# Patient Record
Sex: Male | Born: 1990 | Race: White | Hispanic: No | Marital: Married | Smoking: Former smoker
Health system: Southern US, Community
[De-identification: ages and names within clinical notes are randomized; demographics above are authoritative.]

## PROBLEM LIST (undated history)

## (undated) DIAGNOSIS — K219 Gastro-esophageal reflux disease without esophagitis: Secondary | ICD-10-CM

## (undated) DIAGNOSIS — F411 Generalized anxiety disorder: Secondary | ICD-10-CM

## (undated) DIAGNOSIS — F902 Attention-deficit hyperactivity disorder, combined type: Secondary | ICD-10-CM

## (undated) DIAGNOSIS — I10 Essential (primary) hypertension: Secondary | ICD-10-CM

## (undated) HISTORY — PX: APPENDECTOMY: SHX54

## (undated) HISTORY — DX: Attention-deficit hyperactivity disorder, combined type: F90.2

## (undated) HISTORY — PX: TONSILLECTOMY: SUR1361

## (undated) HISTORY — PX: TOOTH EXTRACTION: SHX859

---
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
## 2008-08-02 ENCOUNTER — Emergency Department: Payer: Self-pay | Admitting: Emergency Medicine

## 2008-08-08 DIAGNOSIS — F329 Major depressive disorder, single episode, unspecified: Secondary | ICD-10-CM | POA: Insufficient documentation

## 2008-08-08 DIAGNOSIS — F429 Obsessive-compulsive disorder, unspecified: Secondary | ICD-10-CM | POA: Insufficient documentation

## 2008-09-19 ENCOUNTER — Emergency Department: Payer: Self-pay | Admitting: Emergency Medicine

## 2009-02-22 DIAGNOSIS — I861 Scrotal varices: Secondary | ICD-10-CM | POA: Insufficient documentation

## 2011-02-07 ENCOUNTER — Emergency Department: Payer: Self-pay | Admitting: Emergency Medicine

## 2011-04-27 ENCOUNTER — Emergency Department: Payer: Self-pay | Admitting: Emergency Medicine

## 2013-11-12 ENCOUNTER — Emergency Department: Payer: Self-pay | Admitting: Emergency Medicine

## 2013-11-12 LAB — URINALYSIS, COMPLETE
Bacteria: NONE SEEN
Glucose,UR: NEGATIVE mg/dL (ref 0–75)
Ketone: NEGATIVE
Leukocyte Esterase: NEGATIVE
Ph: 6 (ref 4.5–8.0)
Specific Gravity: 1.01 (ref 1.003–1.030)
WBC UR: <1 /HPF (ref 0–5)

## 2013-11-12 LAB — COMPREHENSIVE METABOLIC PANEL
BUN: 9 mg/dL (ref 7–18)
Calcium, Total: 8.8 mg/dL (ref 8.5–10.1)
Chloride: 103 mmol/L (ref 98–107)
EGFR (African American): >60
EGFR (Non-African Amer.): >60
Glucose: 101 mg/dL — ABNORMAL HIGH (ref 65–99)
Osmolality: 275 (ref 275–301)
SGOT(AST): 14 U/L — ABNORMAL LOW (ref 15–37)

## 2013-11-12 LAB — CBC
HGB: 14.9 g/dL (ref 13.0–18.0)
MCH: 31.6 pg (ref 26.0–34.0)
MCHC: 34.7 g/dL (ref 32.0–36.0)
MCV: 91 fL (ref 80–100)
RBC: 4.71 10*6/uL (ref 4.40–5.90)
RDW: 13.4 % (ref 11.5–14.5)
WBC: 7.5 10*3/uL (ref 3.8–10.6)

## 2014-04-19 DIAGNOSIS — M5126 Other intervertebral disc displacement, lumbar region: Secondary | ICD-10-CM | POA: Insufficient documentation

## 2014-09-25 ENCOUNTER — Emergency Department: Payer: Self-pay | Admitting: Emergency Medicine

## 2014-10-17 ENCOUNTER — Emergency Department: Payer: Self-pay | Admitting: Emergency Medicine

## 2014-10-21 ENCOUNTER — Emergency Department: Payer: Self-pay | Admitting: Internal Medicine

## 2015-01-17 ENCOUNTER — Emergency Department: Payer: Self-pay | Admitting: Emergency Medicine

## 2015-02-01 ENCOUNTER — Emergency Department (HOSPITAL_COMMUNITY): Payer: BLUE CROSS/BLUE SHIELD

## 2015-02-01 ENCOUNTER — Emergency Department (HOSPITAL_COMMUNITY)
Admission: EM | Admit: 2015-02-01 | Discharge: 2015-02-01 | Disposition: A | Discharge status: Home / Self Care | Payer: BLUE CROSS/BLUE SHIELD | Attending: Emergency Medicine | Admitting: Emergency Medicine

## 2015-02-01 ENCOUNTER — Encounter (HOSPITAL_COMMUNITY): Payer: Self-pay

## 2015-02-01 DIAGNOSIS — R079 Chest pain, unspecified: Secondary | ICD-10-CM

## 2015-02-01 DIAGNOSIS — R0789 Other chest pain: Secondary | ICD-10-CM | POA: Insufficient documentation

## 2015-02-01 DIAGNOSIS — Z720 Tobacco use: Secondary | ICD-10-CM | POA: Insufficient documentation

## 2015-02-01 DIAGNOSIS — M79604 Pain in right leg: Secondary | ICD-10-CM | POA: Insufficient documentation

## 2015-02-01 DIAGNOSIS — R112 Nausea with vomiting, unspecified: Secondary | ICD-10-CM | POA: Insufficient documentation

## 2015-02-01 DIAGNOSIS — M791 Myalgia: Secondary | ICD-10-CM | POA: Diagnosis not present

## 2015-02-01 DIAGNOSIS — M79605 Pain in left leg: Secondary | ICD-10-CM | POA: Diagnosis not present

## 2015-02-01 LAB — CBC
HEMATOCRIT: 47.8 % (ref 39.0–52.0)
Hemoglobin: 16.1 g/dL (ref 13.0–17.0)
MCH: 29.9 pg (ref 26.0–34.0)
MCHC: 33.7 g/dL (ref 30.0–36.0)
MCV: 88.8 fL (ref 78.0–100.0)
Platelets: 232 10*3/uL (ref 150–400)
RBC: 5.38 MIL/uL (ref 4.22–5.81)
RDW: 13.4 % (ref 11.5–15.5)
WBC: 5.5 10*3/uL (ref 4.0–10.5)

## 2015-02-01 LAB — BASIC METABOLIC PANEL
ANION GAP: 5 (ref 5–15)
BUN: 10 mg/dL (ref 6–23)
CALCIUM: 9.6 mg/dL (ref 8.4–10.5)
CO2: 28 mmol/L (ref 19–32)
CREATININE: 0.77 mg/dL (ref 0.50–1.35)
Chloride: 107 mmol/L (ref 96–112)
GFR calc Af Amer: >90 mL/min (ref >90)
Glucose, Bld: 117 mg/dL — ABNORMAL HIGH (ref 70–99)
Potassium: 4.1 mmol/L (ref 3.5–5.1)
Sodium: 140 mmol/L (ref 135–145)

## 2015-02-01 LAB — HEPATIC FUNCTION PANEL
ALT: 17 U/L (ref 0–53)
AST: 20 U/L (ref 0–37)
Albumin: 4.6 g/dL (ref 3.5–5.2)
Alkaline Phosphatase: 59 U/L (ref 39–117)
BILIRUBIN TOTAL: 0.5 mg/dL (ref 0.3–1.2)
Bilirubin, Direct: 0.1 mg/dL (ref 0.0–0.5)
Indirect Bilirubin: 0.4 mg/dL (ref 0.3–0.9)
TOTAL PROTEIN: 7.9 g/dL (ref 6.0–8.3)

## 2015-02-01 LAB — D-DIMER, QUANTITATIVE (NOT AT ARMC)

## 2015-02-01 LAB — I-STAT TROPONIN, ED: Troponin i, poc: 0 ng/mL (ref 0.00–0.08)

## 2015-02-01 LAB — LIPASE, BLOOD: LIPASE: 18 U/L (ref 11–59)

## 2015-02-01 MED ORDER — ACETAMINOPHEN 500 MG PO TABS
1000.0000 mg | ORAL_TABLET | Freq: Once | ORAL | Status: AC
  Administered 2015-02-01: 1000 mg via ORAL
  Filled 2015-02-01: qty 2

## 2015-02-01 MED ORDER — ONDANSETRON 4 MG PO TBDP
4.0000 mg | ORAL_TABLET | Freq: Once | ORAL | Status: AC
  Administered 2015-02-01: 4 mg via ORAL
  Filled 2015-02-01: qty 1

## 2015-02-01 NOTE — Discharge Instructions (Signed)
Chest Pain (Nonspecific) Take Tylenol or Advil as directed for pain. Call the wellness Center or any of the numbers on the resource guide to get a primary care physician. Ask your new doctor to help you to stop smoking It is often hard to give a specific diagnosis for the cause of chest pain. There is always a chance that your pain could be related to something serious, such as a heart attack or a blood clot in the lungs. You need to follow up with your health care provider for further evaluation. CAUSES   Heartburn.  Pneumonia or bronchitis.  Anxiety or stress.  Inflammation around your heart (pericarditis) or lung (pleuritis or pleurisy).  A blood clot in the lung.  A collapsed lung (pneumothorax). It can develop suddenly on its own (spontaneous pneumothorax) or from trauma to the chest.  Shingles infection (herpes zoster virus). The chest wall is composed of bones, muscles, and cartilage. Any of these can be the source of the pain.  The bones can be bruised by injury.  The muscles or cartilage can be strained by coughing or overwork.  The cartilage can be affected by inflammation and become sore (costochondritis). DIAGNOSIS  Lab tests or other studies may be needed to find the cause of your pain. Your health care provider may have you take a test called an ambulatory electrocardiogram (ECG). An ECG records your heartbeat patterns over a 24-hour period. You may also have other tests, such as:  Transthoracic echocardiogram (TTE). During echocardiography, sound waves are used to evaluate how blood flows through your heart.  Transesophageal echocardiogram (TEE).  Cardiac monitoring. This allows your health care provider to monitor your heart rate and rhythm in real time.  Holter monitor. This is a portable device that records your heartbeat and can help diagnose heart arrhythmias. It allows your health care provider to track your heart activity for several days, if needed.  Stress  tests by exercise or by giving medicine that makes the heart beat faster. TREATMENT   Treatment depends on what may be causing your chest pain. Treatment may include:  Acid blockers for heartburn.  Anti-inflammatory medicine.  Pain medicine for inflammatory conditions.  Antibiotics if an infection is present.  You may be advised to change lifestyle habits. This includes stopping smoking and avoiding alcohol, caffeine, and chocolate.  You may be advised to keep your head raised (elevated) when sleeping. This reduces the chance of acid going backward from your stomach into your esophagus. Most of the time, nonspecific chest pain will improve within 2-3 days with rest and mild pain medicine.  HOME CARE INSTRUCTIONS   If antibiotics were prescribed, take them as directed. Finish them even if you start to feel better.  For the next few days, avoid physical activities that bring on chest pain. Continue physical activities as directed.  Do not use any tobacco products, including cigarettes, chewing tobacco, or electronic cigarettes.  Avoid drinking alcohol.  Only take medicine as directed by your health care provider.  Follow your health care provider's suggestions for further testing if your chest pain does not go away.  Keep any follow-up appointments you made. If you do not go to an appointment, you could develop lasting (chronic) problems with pain. If there is any problem keeping an appointment, call to reschedule. SEEK MEDICAL CARE IF:   Your chest pain does not go away, even after treatment.  You have a rash with blisters on your chest.  You have a fever. SEEK IMMEDIATE  MEDICAL CARE IF:   You have increased chest pain or pain that spreads to your arm, neck, jaw, back, or abdomen.  You have shortness of breath.  You have an increasing cough, or you cough up blood.  You have severe back or abdominal pain.  You feel nauseous or vomit.  You have severe weakness.  You  faint.  You have chills. This is an emergency. Do not wait to see if the pain will go away. Get medical help at once. Call your local emergency services (911 in U.S.). Do not drive yourself to the hospital. MAKE SURE YOU:   Understand these instructions.  Will watch your condition.  Will get help right away if you are not doing well or get worse. Document Released: 09/10/2005 Document Revised: 12/06/2013 Document Reviewed: 07/06/2008 [facility] Patient Information 2015 [place], [place]. This information is not intended to replace advice given to you by your health care provider. Make sure you discuss any questions you have with your health care provider.  Emergency Department Resource Guide 1) Find a Doctor and Pay Out of Pocket Although you won't have to find out who is covered by your insurance plan, it is a good idea to ask around and get recommendations. You will then need to call the office and see if the doctor you have chosen will accept you as a new patient and what types of options they offer for patients who are self-pay. Some doctors offer discounts or will set up payment plans for their patients who do not have insurance, but you will need to ask so you aren't surprised when you get to your appointment.  2) Contact Your Local Health Department Not all health departments have doctors that can see patients for sick visits, but many do, so it is worth a call to see if yours does. If you don't know where your local health department is, you can check in your phone book. The CDC also has a tool to help you locate your state's health department, and many state websites also have listings of all of their local health departments.  3) Find a Walk-in Clinic If your illness is not likely to be very severe or complicated, you may want to try a walk in clinic. These are popping up all over the country in pharmacies, drugstores, and shopping centers. They're usually staffed by nurse practitioners  or physician assistants that have been trained to treat common illnesses and complaints. They're usually fairly quick and inexpensive. However, if you have serious medical issues or chronic medical problems, these are probably not your best option.  No Primary Care Doctor: - Call Health Connect at  [phone] - they can help you locate a primary care doctor that  accepts your insurance, provides certain services, etc. - Physician Referral Service- [phone]  Chronic Pain Problems: Organization         Address  Phone   Notes  [facility]  [phone] Patients need to be referred by their primary care doctor.   Medication Assistance: Organization         Address  Phone   Notes  [facility]ication [facility] [address] [place] [place], [place] [address] [phone] --Must be a resident of [facility] -- Must have NO insurance coverage whatsoever (no Medicaid/ Medicare, etc.) -- The pt. MUST have a primary care doctor that directs their care regularly and follows them in the community   [facility]ist  [phone]  [employer]  [phone]    Agencies that provide inexpensive medical care: Organization         Address  Phone   Notes  [facility]  [phone]   [facility]    [phone]   [facility] [address] [place], [place] [address] [phone]   [facility] [address] [place] [place] [phone]   Planned Parenthood    [phone]   [facility]    [phone]   [facility]  [address], [place] Phone:  [phone], Fax:  [phone] Hours of Operation:  9 am - 6 pm, M-F.  Also accepts Medicaid/Medicare and self-pay.  [facility]  [address], [place] Phone: [phone], Fax: [phone]. Hours of Operation:  8:30 am - 5:30 pm, M-F.   Also accepts Medicaid and self-pay.  [facility] [place] [address] [place] Phone: [phone]   [facility] [address] Natasha Bence [place], [place] [phone], Ext. [phone] Mondays & Thursdays: 7-9 AM.  First 15 patients are seen on a first come, first serve basis.    Medicaid-accepting [facility] Providers:  Organization         Address  Phone   Notes  [facility] [address], [place] [phone] Also accepts self-pay patients.  [facility] [address] Laurell Josephs [place], [place]  [phone]   [facility] [address], [place] [phone]   [facility] [address], [place] [phone]   Renaye Rakers [address], [place]   [phone] Only accepts [place] patients after they have their name applied to their card.   Self-Pay (no insurance) in [facility]:  Organization         Address  Phone   Notes  Sickle Cell Patients, [facility] [address] [place], [place] [phone]   [facility] [address] [place], [place] [phone]   [facility] [place]  [address], [place] [phone]   [facility]/Dr. Osei-Bonsu  [address], [place] or [address], [place] [phone] Phone number for both [place] and [place] locations is the same.  [facility] [address] [place] [phone]   [facility] [address], [place] or [address] [phone]   [facility] [address] [place] [phone], phone; [phone], fax Sees patients 1st and 3rd Saturday of every month.  Must not qualify for public or private insurance (i.e. Medicaid, Medicare, [place] Health Choice, Veterans'  Benefits)  Household income should be no more than 200% of the poverty level The clinic cannot treat you if you are pregnant or think you are pregnant  Sexually transmitted diseases are not treated at the clinic.    Dental Care: Organization         Address  Phone  Notes  [facility] [address] [place], [place] [phone] Accepts children up to age 70 who are enrolled in [place] or [place] Health Choice; pregnant women with a Medicaid card;  and children who have applied for Medicaid or [place] Health Choice, but were declined, whose parents can pay a reduced fee at time of service.  [facility] [place]  [address], [place] [phone] Accepts children up to age 26 who are enrolled in [place] or [place] Health Choice; pregnant women with a Medicaid card; and children who have applied for Medicaid or [place] Health Choice, but were declined, whose parents can pay a reduced fee at time of service.  [facility]  [address], [place] [phone] Patients are seen by appointment only. Walk-ins are not accepted. [facility] will see patients 26 years of age and older. Monday - Tuesday (8am-5pm) Most Wednesdays (8:30-5pm) $30 per visit, cash only  [facility]  [address], [facility] [phone] Patients are seen by appointment only. Walk-ins are not accepted. [facility] will see patients 26 years of age and older. One Wednesday Evening (Monthly: Volunteer Based).  $30 per visit, cash only  [facility]  [phone] for adults; Children under age 534, call Graduate Pediatric Dentistry at [phone]. Children aged 624-14, please call [phone] to request a pediatric application.  Dental services are provided in all areas of dental care including fillings, crowns and bridges, complete and partial  dentures, implants, gum treatment, root canals, and extractions. Preventive care is also provided. Treatment is provided to both adults and children. Patients are selected via a lottery and there is often a waiting list.   [facility] [address], [place]  [phone] [email or web address]   [facility] [address], [place], [place] [phone], Ext. [phone] Second and Fourth Thursday of each month, opens at 6:30 AM; Clinic ends at 9 AM.  Patients are seen on a first-come first-served basis, and a limited number are seen during each clinic.   [facility]  [address], [place], [place] [phone]   Eligibility Requirements You must have lived in [place], [place], or [place] counties for at least the last three months.   You cannot be eligible for state or federal sponsored [place] Cityhealthcare insurance, including CIGNAVeterans Administration, [place]icaid, or [employer]icare.   You generally cannot be eligible for healthcare insurance through your employer.    How to apply: Eligibility screenings are held every Tuesday and Wednesday afternoon from 1:00 pm until 4:00 pm. You do not need an appointment for the interview!  [facility] [address], [place], [place] [phone]   [facility]  [phone]   [facility]  [phone]   [facility]  [phone]    Behavioral Health Resources in the Community: Intensive Outpatient Programs Organization         Address  Phone  Notes  [facility] [address], [place], [place] [phone]   [facility] [address], [place], [place] [phone]   [facility] [address], [place], [place]  [phone]   [facility] [address],  [place], [place] [phone] or [phone]   Substance Abuse Resources Organization          Address  Phone  Notes  [facility]  [phone]   [facility]  [phone]   [facility]  [phone]   [facility]  [phone]  Residential & Outpatient Substance Abuse Program  [phone]   Psychological Services Organization         Address  Phone  Notes  [facility] Health  [phone]   [facility] Services  [phone]   [facility] [phone] [address], [place] [phone] or [phone]    Mobile Crisis Teams Organization         Address  Phone  Notes  [facility], Mobile Crisis Care Unit  [phone]   [facility]  [address] [place], [place] [phone]   Doristine Locks [address] [place] [phone]    Self-Help/Support Groups Organization         Address  Phone             Notes  [facility] [place] - variety of support groups  [phone]- [phone] Call for more information  Narcotics Anonymous (NA), [facility] [address] [place] [address], [employer] [place]  2 meetings at this location   Statistician         Address  Phone  Notes  [facility] [address],    [place]  [phone]   [facility]  [address] [place] [address], [place], [place] [phone]   [facility] [address] [place], [place] [phone] Admissions: 8am-3pm M-F  [facility] [address],    [place], [place] [phone]   [facility] [address] [place], [place], [place] [phone]   [facility] [address]  [place], [place] [phone]   [facility] - Intensive Outpatient [address] Laurell Josephs [address], [place], [place] [phone]   [facility] ([facility] [address] [place].,  [place], [place] [phone] or [phone]   [facility] ([facility]) [address] [place], [place]  [phone] Accepts Medicaid  [facility] [address]  [place] [phone] Substance Abuse/Addiction Treatment   [facility] Organization         Address  Phone  Notes  [facility]  [phone]   Angie Fava, PhD [address] Ervin Knack [place], [place]   [phone] or [phone]   [facility] Behavioral   [address] [place], [place] [phone]   [facility] [address] [place], [place] [phone] Insurance/Medicaid/sponsorship through [facility] and Families [address]                                    [place], [place] [phone] Therapy/tele-psych/case  [facility] [address] [place], [place] [phone]    Dr. Lolly Mustache  [phone]   [facility]  [employer] [facility] Dept. 1) [address], [place] 2) [address], [place] 3)  [address], [place] [phone]  [phone]   [facility] Child Abuse Hotline (706)843-9871 or [phone] (After Hours)

## 2015-02-01 NOTE — ED Notes (Signed)
Pt presents with c/o chest pain for several days. Pt reports that the chest pain is in the left and center of his chest. Pt also reporting abdominal pain with vomiting. Denies diarrhea.

## 2015-02-01 NOTE — ED Notes (Signed)
Nurse starting IV 

## 2015-02-01 NOTE — ED Provider Notes (Signed)
CSN: [id number]     Arrival date & time 02/01/15  1219 History   First MD Initiated Contact with Patient 02/01/15 1226     Chief Complaint  Patient presents with  . Chest Pain     (Consider location/radiation/quality/duration/timing/severity/associated sxs/prior Treatment) HPI Complains of left anterior chest pain onset yesterday, constant. pain waxes and wanes with exacerbations lasting 10 seconds at a time. He reports he vomited once yesterday also complains of diffuse abdominal pain which is constant since yesterday pain is mild. No other associated symptoms. Pain is nonexertional. He also describes crampy pains in both calves and yesterday. No treatment prior to coming here nothing makes symptoms better or worse. Presently discomfort is mild and chest abdomen and bilateral calves History reviewed. No pertinent past medical history. Past Surgical History  Procedure Laterality Date  . Tonsillectomy     No family history on file. History  Substance Use Topics  . Smoking status: Current Every Day Smoker  . Smokeless tobacco: Not on file  . Alcohol Use: Yes     Comment: occasionally     Review of Systems  Cardiovascular: Positive for chest pain.  Gastrointestinal: Positive for nausea and vomiting.       Vomited one time yesterday. Slight nausea now  Musculoskeletal: Positive for myalgias.       Bilateral calf pain      Allergies  Sulfa antibiotics  Home Medications   Prior to Admission medications   Not on File   BP 145/83 mmHg  Pulse 79  Temp(Src) 98.3 F (36.8 C) (Oral)  Resp 20  SpO2 100% Physical Exam  Constitutional: He appears well-developed and well-nourished.  HENT:  Head: Normocephalic and atraumatic.  Eyes: Conjunctivae are normal. Pupils are equal, round, and reactive to light.  Neck: Neck supple. No tracheal deviation present. No thyromegaly present.  Cardiovascular: Normal rate and regular rhythm.   No murmur heard. Pulmonary/Chest: Effort normal  and breath sounds normal.  Abdominal: Soft. Bowel sounds are normal. He exhibits no distension. There is no tenderness.  Musculoskeletal: Normal range of motion. He exhibits no edema or tenderness.  Neurological: He is alert. Coordination normal.  Skin: Skin is warm and dry. No rash noted.  Psychiatric: He has a normal mood and affect.  Nursing note and vitals reviewed.   ED Course  Procedures (including critical care time) Labs Review Labs Reviewed  CBC  BASIC METABOLIC PANEL  I-STAT TROPOININ, ED    Imaging Review No results found.   EKG Interpretation   Date/Time:  Thursday February 01 2015 12:25:06 EST Ventricular Rate:  78 PR Interval:  162 QRS Duration: 94 QT Interval:  356 QTC Calculation: 405 R Axis:   88 Text Interpretation:  Sinus rhythm Right atrial enlargement Consider right  ventricular hypertrophy Baseline wander in lead(s) V2 V4 No old tracing to  compare Confirmed by Mina Babula  MD, Amylee Lodato [phone]([id number]) on 02/01/2015 12:35:58 PM     3:20 PM nausea and resolved after treatment with Zofran. Diffuse aches continue after treatment with Tylenol. Chest x-ray viewed by me Results for orders placed or performed during the hospital encounter of 02/01/15  Basic metabolic panel  Result Value Ref Range   Sodium 140 135 - 145 mmol/L   Potassium 4.1 3.5 - 5.1 mmol/L   Chloride 107 96 - 112 mmol/L   CO2 28 19 - 32 mmol/L   Glucose, Bld 117 (H) 70 - 99 mg/dL   BUN 10 6 - 23 mg/dL   Creatinine, Ser 1.910.77  0.50 - 1.35 mg/dL   Calcium 9.6 8.4 - 04.5 mg/dL   GFR calc non Af Amer >90 >90 mL/min   GFR calc Af Amer >90 >90 mL/min   Anion gap 5 5 - 15  Hepatic function panel  Result Value Ref Range   Total Protein 7.9 6.0 - 8.3 g/dL   Albumin 4.6 3.5 - 5.2 g/dL   AST 20 0 - 37 U/L   ALT 17 0 - 53 U/L   Alkaline Phosphatase 59 39 - 117 U/L   Total Bilirubin 0.5 0.3 - 1.2 mg/dL   Bilirubin, Direct 0.1 0.0 - 0.5 mg/dL   Indirect Bilirubin 0.4 0.3 - 0.9 mg/dL  Lipase, blood   Result Value Ref Range   Lipase 18 11 - 59 U/L  D-dimer, quantitative  Result Value Ref Range   D-Dimer, Quant <0.27 0.00 - 0.48 ug/mL-FEU  I-stat troponin, ED (not at [facility])  Result Value Ref Range   Troponin i, poc 0.00 0.00 - 0.08 ng/mL   Comment 3           Dg Chest 2 View  02/01/2015   CLINICAL DATA:  One day history of left-sided chest pain and difficulty breathing  EXAM: CHEST  2 VIEW  COMPARISON:  November 12, 2013  FINDINGS: The lungs are clear. Heart size and pulmonary vascularity are normal. No adenopathy. No pneumothorax. No bone lesions.  IMPRESSION: No edema or consolidation.   Electronically Signed   By: Bretta Bang III M.D.   On: 02/01/2015 13:42    MDM  Cardiac risk factors include smoker, family history father had  MI age21. Patient is PERC negative, Wells score for PE negative 2, Wells score for DVT neg 2. Heart score equals 1 Final diagnoses:  None   I counseled patient for 5 minutes on smoking cessation. Plan referral [facility] [facility]. Tylenol or Advil for pain. Diagnosed 1 atypical chest pain #2 tobacco abuse    Doug Sou, MD 02/01/15 1525

## 2015-02-01 NOTE — ED Notes (Signed)
Pt c/o nausea, spoke with ED provider, 4 mg Zofran po prescribed

## 2015-06-08 ENCOUNTER — Other Ambulatory Visit: Payer: Self-pay

## 2015-06-08 ENCOUNTER — Encounter: Payer: Self-pay | Admitting: *Deleted

## 2015-06-08 ENCOUNTER — Emergency Department
Admission: EM | Admit: 2015-06-08 | Discharge: 2015-06-08 | Disposition: A | Discharge status: Home / Self Care | Payer: BLUE CROSS/BLUE SHIELD | Attending: Emergency Medicine | Admitting: Emergency Medicine

## 2015-06-08 DIAGNOSIS — R002 Palpitations: Secondary | ICD-10-CM | POA: Insufficient documentation

## 2015-06-08 DIAGNOSIS — R0789 Other chest pain: Secondary | ICD-10-CM | POA: Diagnosis not present

## 2015-06-08 DIAGNOSIS — Z720 Tobacco use: Secondary | ICD-10-CM | POA: Insufficient documentation

## 2015-06-08 DIAGNOSIS — R000 Tachycardia, unspecified: Secondary | ICD-10-CM | POA: Diagnosis present

## 2015-06-08 LAB — COMPREHENSIVE METABOLIC PANEL
ALBUMIN: 4.3 g/dL (ref 3.5–5.0)
ALT: 19 U/L (ref 17–63)
AST: 19 U/L (ref 15–41)
Alkaline Phosphatase: 52 U/L (ref 38–126)
Anion gap: 8 (ref 5–15)
BILIRUBIN TOTAL: 0.5 mg/dL (ref 0.3–1.2)
BUN: 12 mg/dL (ref 6–20)
CO2: 27 mmol/L (ref 22–32)
CREATININE: 0.75 mg/dL (ref 0.61–1.24)
Calcium: 9.6 mg/dL (ref 8.9–10.3)
Chloride: 104 mmol/L (ref 101–111)
GFR calc Af Amer: >60 mL/min (ref >60)
GFR calc non Af Amer: >60 mL/min (ref >60)
Glucose, Bld: 95 mg/dL (ref 65–99)
POTASSIUM: 4.4 mmol/L (ref 3.5–5.1)
SODIUM: 139 mmol/L (ref 135–145)
Total Protein: 7.3 g/dL (ref 6.5–8.1)

## 2015-06-08 LAB — CBC
HEMATOCRIT: 44.6 % (ref 40.0–52.0)
HEMOGLOBIN: 14.8 g/dL (ref 13.0–18.0)
MCH: 29.9 pg (ref 26.0–34.0)
MCHC: 33.3 g/dL (ref 32.0–36.0)
MCV: 89.8 fL (ref 80.0–100.0)
PLATELETS: 188 10*3/uL (ref 150–440)
RBC: 4.97 MIL/uL (ref 4.40–5.90)
RDW: 14.4 % (ref 11.5–14.5)
WBC: 7.3 10*3/uL (ref 3.8–10.6)

## 2015-06-08 LAB — TROPONIN I: Troponin I: <0.03 ng/mL (ref <0.031)

## 2015-06-08 NOTE — Discharge Instructions (Signed)
Your EKG looks good. Your cardiac enzyme was negative. Her other blood tests were good. Your blood pressure and heart rate are very good. Follow-up with another doctor for ongoing care. If you do have further episodes of feeling like your heart rate is fast or if you have any palpitations, measure her pulse as we have discussed. Consider making a plan to quit smoking. He had no advantage of continuing. It's a waste of money in bad for you. Return to the emergency department if you have increasing pain, shortness of breath, fast heart rate, or give other urgent concerns.  Palpitations A palpitation is the feeling that your heartbeat is irregular. It may feel like your heart is fluttering or skipping a beat. It may also feel like your heart is beating faster than normal. This is usually not a serious problem. In some cases, you may need more medical tests. HOME CARE  Avoid:  Caffeine in coffee, tea, soft drinks, diet pills, and energy drinks.  Chocolate.  Alcohol.  Stop smoking if you smoke.  Reduce your stress and anxiety. Try:  A method that measures bodily functions so you can learn to control them (biofeedback).  Yoga.  Meditation.  Physical activity such as swimming, jogging, or walking.  Get plenty of rest and sleep. GET HELP IF:  Your fast or irregular heartbeat continues after 24 hours.  Your palpitations occur more often. GET HELP RIGHT AWAY IF:   You have chest pain.  You feel short of breath.  You have a very bad headache.  You feel dizzy or pass out (faint). MAKE SURE YOU:   Understand these instructions.  Will watch your condition.  Will get help right away if you are not doing well or get worse. Document Released: 09/09/2008 Document Revised: 04/17/2014 Document Reviewed: 01/30/2012 [facility] Patient Information 2015 [place], [place]. This information is not intended to replace advice given to you by your health care provider. Make sure you discuss any  questions you have with your health care provider.

## 2015-06-08 NOTE — ED Notes (Signed)
See assessment notes. Pt states "this has happened to me before and everyone tells me it sounds like a panic attack."

## 2015-06-08 NOTE — ED Notes (Signed)
Has had episodes of left chest pain for several months. Yesterday developed tingling all over, works in hot environment, felt pounding in chest, pain in left chest

## 2015-06-08 NOTE — ED Provider Notes (Signed)
[facility] Emergency Department Provider Note  ____________________________________________  Time seen: 1950  I have reviewed the triage vital signs and the nursing notes.   HISTORY  Chief Complaint Tachycardia palpitations yesterday Chest pain for 6 months    HPI Lee Sandoval is a 24 y.o. male who was at work yesterday when he began to feel that his heart was going fast. He works in a lab. 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 He reports he was approximately 93 in the lab. He left the lab and went for a walk to try to cool down. He felt his heart was beating fast and he tried to take his pulse. He measured it out for 1 minute and reports the heart rate was between 90 and 100.  He felt anxious because of the faster rate and then felt the palpitations were worse.  Patient left work and went home. He was feeling a little bit better but he noticed he had some chest pain and his left chest. He reports this pain has been there for approximately 6 months. He was seen at [facility] long for this condition approximate 6 months ago with a negative workup.  Patient also notes that last night he was engaged in sexual activity with his wife and he felt his heart was pounding. At that time he did not feel the rate was fast.    History reviewed. No pertinent past medical history.  There are no active problems to display for this patient.   Past Surgical History  Procedure Laterality Date  . Tonsillectomy      Current Outpatient Rx  Name  Route  Sig  Dispense  Refill  . ibuprofen (ADVIL,MOTRIN) 800 MG tablet   Oral   Take 800 mg by mouth every 6 (six) hours as needed for headache or moderate pain.           Allergies Sulfa antibiotics  No family history on file.  Social History History  Substance Use Topics  . Smoking status: Current Every Day Smoker  . Smokeless tobacco: Not on file  . Alcohol Use: Yes     Comment: occasionally     Review of  Systems  Constitutional: Negative for fever. ENT: Negative for sore throat. Cardiovascular: Palpitations and some history of chest discomfort. See history of present illness Respiratory: Negative for shortness of breath. Gastrointestinal: Negative for abdominal pain, vomiting and diarrhea. Genitourinary: Negative for dysuria. Musculoskeletal: No myalgias or injuries. Skin: Negative for rash. Neurological: Negative for headaches   10-point ROS otherwise negative.  ____________________________________________   PHYSICAL EXAM:  VITAL SIGNS: ED Triage Vitals  Enc Vitals Group     BP 06/08/15 1619 132/75 mmHg     Pulse Rate 06/08/15 1619 82     Resp 06/08/15 1619 20     Temp 06/08/15 1619 98.1 F (36.7 C)     Temp Source 06/08/15 1619 Oral     SpO2 06/08/15 1619 98 %     Weight --      Height --      Head Cir --      Peak Flow --      Pain Score 06/08/15 1620 6     Pain Loc --      Pain Edu? --      Excl. in GC? --     Constitutional: Well-nourished well-developed 24 year old male. Alert and oriented. Well appearing and in no distress. ENT   Head: Normocephalic and atraumatic.   Nose: No congestion/rhinnorhea.  Mouth/Throat: Mucous membranes are moist. Cardiovascular: Normal rate in the 70s, regular rhythm, no murmur noted Chest wall: Mild but notable tenderness in the left chest. Respiratory:  Normal respiratory effort, no tachypnea.    Breath sounds are clear and equal bilaterally.  Gastrointestinal: Soft and nontender. No distention.  Back: No muscle spasm, no tenderness, no CVA tenderness. Musculoskeletal: No deformity noted. Nontender with normal range of motion in all extremities.  No noted edema. Neurologic:  Normal speech and language. No gross focal neurologic deficits are appreciated.  Skin:  Skin is warm, dry. No rash noted. Psychiatric: Mood and affect are normal. Speech and behavior are normal. Patient is a little bit anxious about these  episodes. ____________________________________________    LABS (pertinent positives/negatives)  Normal CBC, calm breaths metabolic panel, and troponin. ____________________________________________   EKG  ED ECG REPORT I, Delmore Sear W, the attending physician, personally viewed and interpreted this ECG.   Date: 06/08/2015  EKG Time: 1635  Rate: 70  Rhythm: Normal sinus rhythm  Axis: Normal  Intervals: Normal  ST&T Change: None noted   ____________________________________________  ____________________________________________   INITIAL IMPRESSION / ASSESSMENT AND PLAN / ED COURSE  Pertinent labs & imaging results that were available during my care of the patient were reviewed by me and considered in my medical decision making (see chart for details).   Well and healthy appearing 24 year old male in no acute distress. He does have some nervousness and anxiety about his events yesterday as well as about the discomfort he has had in his chest on palpation for over 6 months. This patient may benefit from further outpatient evaluation, although my clinical suspicion for a more significant pathology is extremely low. With normal blood tests and a normal EKG, I will discharge him for follow-up. We will refer him to [facility] clinic for any ongoing evaluation.  I have discussed smoking cessation with this patient. We discussed different strategies for quitting and his motivations for continuing.  ____________________________________________   FINAL CLINICAL IMPRESSION(S) / ED DIAGNOSES  Final diagnoses:  Heart palpitations  Chest wall pain      Darien Ramus, MD 06/08/15 2013

## 2015-06-12 ENCOUNTER — Ambulatory Visit (INDEPENDENT_AMBULATORY_CARE_PROVIDER_SITE_OTHER): Payer: BLUE CROSS/BLUE SHIELD | Admitting: Family Medicine

## 2015-06-12 ENCOUNTER — Encounter: Payer: Self-pay | Admitting: Family Medicine

## 2015-06-12 VITALS — BP 118/64 | HR 80 | Temp 97.3°F | Resp 18 | Ht 73.5 in | Wt 153.9 lb

## 2015-06-12 DIAGNOSIS — G4701 Insomnia due to medical condition: Secondary | ICD-10-CM | POA: Diagnosis not present

## 2015-06-12 DIAGNOSIS — F411 Generalized anxiety disorder: Secondary | ICD-10-CM

## 2015-06-12 DIAGNOSIS — F17210 Nicotine dependence, cigarettes, uncomplicated: Secondary | ICD-10-CM | POA: Insufficient documentation

## 2015-06-12 DIAGNOSIS — Z720 Tobacco use: Secondary | ICD-10-CM | POA: Diagnosis not present

## 2015-06-12 MED ORDER — DIAZEPAM 5 MG PO TABS: 5.0000 mg | ORAL_TABLET | Freq: Two times a day (BID) | ORAL | Status: DC | PRN

## 2015-06-12 MED ORDER — VENLAFAXINE HCL 37.5 MG PO TABS: 37.5000 mg | ORAL_TABLET | Freq: Two times a day (BID) | ORAL | Status: DC

## 2015-06-12 MED ORDER — TRAZODONE HCL 50 MG PO TABS: 50.0000 mg | ORAL_TABLET | Freq: Every day | ORAL | Status: DC

## 2015-06-12 NOTE — Progress Notes (Signed)
Name: York CeriseMichael Allen Beebe   MRN: [id number]    DOB: Mar 09, 1991   Date:06/12/2015       Progress Note  Subjective  Chief Complaint  Chief Complaint  Patient presents with  . Establish Care  . Follow-up    patient is here for a ER follow-up.     HPI  Anxiety: Patient complains of anxiety disorder, panic attacks and sleep disturbance.  He has the following symptoms: chest pain, feelings of losing control, insomnia, palpitations, racing thoughts, shortness of breath, sweating. Onset of symptoms was approximately several years ago, gradually worsening since that time. He denies current suicidal and homicidal ideation. Family history significant for anxiety and depression.Possible organic causes contributing are: none. Risk factors: positive family history in  mother and negative life event recently his biological father passed away and he attended the funeral where several people had no idea who he was. He took a friend's valium and he found that it really calmed him down and he was able to attend the funeral. Previous treatment includes Valium and psychiatry therapy when he was a child.  He complains of the following side effects from the treatment: none. Associated medical history includes ADD/ADHD. However he functions well at work and is actually quite productive. His boss is more concerned about his social interactions and panic episodes consisting of sudden onset chest pain, palpitations, perspiration, tachypnea. He feels this way around his co-workers and larger social gatherings of familiar people but not strangers. 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  In the past has tried Medical sales representativetraterra, Adderall, Seroquel. Had various intolerances such as feeling like a zombie, eye swelling, weight loss.   History  Substance Use Topics  . Smoking status: Current  Every Day Smoker -- 1.00 packs/day    Types: Cigarettes  . Smokeless tobacco: Never Used  . Alcohol Use: 0.0 oz/week    0 Standard drinks or equivalent per week     Comment: occasionally     No current outpatient prescriptions on file.  Past Surgical History  Procedure Laterality Date  . Tonsillectomy    . Tooth extraction      Family History  Problem Relation Age of Onset  . Heart failure Father     died at age 26    Allergies  Allergen Reactions  . Sulfa Antibiotics Hives    High fever.      Review of Systems  CONSTITUTIONAL: No significant weight changes, fever, chills, weakness or fatigue.  HEENT:  - Eyes: No visual changes.  - Ears: No auditory changes. No pain.  - Nose: No sneezing, congestion, runny nose. - Throat: No sore throat. No changes in swallowing. SKIN: No rash or itching.  CARDIOVASCULAR: No chest pain, chest pressure or chest discomfort. No palpitations or edema.  RESPIRATORY: No shortness of breath, cough or sputum.  GASTROINTESTINAL: No anorexia, nausea, vomiting. No changes in bowel habits. No abdominal pain or blood.  GENITOURINARY: No dysuria. No frequency. No discharge. NEUROLOGICAL: No headache, dizziness, syncope, paralysis, ataxia, numbness or tingling in the extremities. No memory changes. No change in bowel or bladder control.  MUSCULOSKELETAL: No joint pain. No muscle pain. HEMATOLOGIC: No anemia, bleeding or bruising.  LYMPHATICS: No enlarged lymph nodes.  PSYCHIATRIC: Significant changes. ENDOCRINOLOGIC: No reports of sweating, cold or heat intolerance. No polyuria or polydipsia.   Objective  BP  118/64 mmHg  Pulse 80  Temp(Src) 97.3 F (36.3 C) (Oral)  Resp 18  Ht 6' 1.5" (1.867 m)  Wt 153 lb 14.4 oz (69.809 kg)  BMI 20.03 kg/m2  SpO2 96% Body mass index is 20.03 kg/(m^2).  Depression screen PHQ 2/9 06/12/2015  Decreased Interest 1  Down, Depressed, Hopeless 3  PHQ - 2 Score 4  Altered sleeping 3  Tired, decreased  energy 3  Change in appetite 0  Feeling bad or failure about yourself  0  Trouble concentrating 3  Moving slowly or fidgety/restless 3  Suicidal thoughts 0  PHQ-9 Score 16  Difficult doing work/chores Very difficult     Physical Exam  Constitutional: Patient appears well-developed and well-nourished. In no distress.  HEENT:  - Head: Normocephalic and atraumatic.  - Ears: Bilateral TMs gray, no erythema or effusion - Nose: Nasal mucosa moist - Mouth/Throat: Oropharynx is clear and moist. No tonsillar hypertrophy or erythema. No post nasal drainage.  - Eyes: Conjunctivae clear, EOM movements normal. PERRLA. No scleral icterus.  Neck: Normal range of motion. Neck supple. No JVD present. No thyromegaly present.  Cardiovascular: Normal rate, regular rhythm and normal heart sounds.  No murmur heard.  Pulmonary/Chest: Effort normal and breath sounds normal. No respiratory distress. Musculoskeletal: Normal range of motion bilateral UE and LE, no joint effusions. Peripheral vascular: Bilateral LE no edema. Neurological: CN II-XII grossly intact with no focal deficits. Alert and oriented to person, place, and time. Coordination, balance, strength, speech and gait are normal.  Skin: Skin is warm and dry. No rash noted. No erythema.  Psychiatric: Patient is expressive, intelligent and communicative. Behavior is normal in office today. Judgment and thought content normal in office today.    Assessment & Plan  1. Generalized anxiety disorder Has been seen in the ER with negative blood work including troponins, ECG, CXR. We had a good discussion regarding his past and correlation to his current symptoms which I do not feel are cardiac in nature but rather generalized anxiety disorder with panic attacks. He has not been on an SSRI in the past but note that his cousin did not like how it made him feel. Given his symptoms correlate with more GAD than depression I feel that he would benefit from an  SNRI along with prn benzodiazepine use. I encouraged Herman to see a psychiatrist or psychologist as well but he has declined for now due to finances and resources.   The patient has been counseled on the proper use, side effects and potential interactions of the new medication. Patient encouraged to review the side effects and safety profile pamphlet provided with the prescription from the pharmacy as well as request counseling from the pharmacy team as needed. Call if worsening symptoms. Do not start all new medications at once.    - venlafaxine (EFFEXOR) 37.5 MG tablet; Take 1 tablet (37.5 mg total) by mouth 2 (two) times daily.  Dispense: 60 tablet; Refill: 1 - diazepam (VALIUM) 5 MG tablet; Take 1 tablet (5 mg total) by mouth 2 (two) times daily as needed for anxiety.  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  - traZODone (DESYREL) 50 MG tablet; Take 1 tablet (50 mg total) by mouth at bedtime.  Dispense: 30 tablet; Refill: 1

## 2015-06-12 NOTE — Patient Instructions (Signed)
Insomnia  Medication to try OTC: SLEEP MD   Insomnia is frequent trouble falling and/or staying asleep. Insomnia can be a long term problem or a short term problem. Both are common. Insomnia can be a short term problem when the wakefulness is related to a certain stress or worry. Long term insomnia is often related to ongoing stress during waking hours and/or poor sleeping habits. Overtime, sleep deprivation itself can make the problem worse. Every little thing feels more severe because you are overtired and your ability to cope is decreased. CAUSES   Stress, anxiety, and depression.  Poor sleeping habits.  Distractions such as TV in the bedroom.  Naps close to bedtime.  Engaging in emotionally charged conversations before bed.  Technical reading before sleep.  Alcohol and other sedatives. They may make the problem worse. They can hurt normal sleep patterns and normal dream activity.  Stimulants such as caffeine for several hours prior to bedtime.  Pain syndromes and shortness of breath can cause insomnia.  Exercise late at night.  Changing time zones may cause sleeping problems (jet lag). It is sometimes helpful to have someone observe your sleeping patterns. They should look for periods of not breathing during the night (sleep apnea). They should also look to see how long those periods last. If you live alone or observers are uncertain, you can also be observed at a sleep clinic where your sleep patterns will be professionally monitored. Sleep apnea requires a checkup and treatment. Give your caregivers your medical history. Give your caregivers observations your family has made about your sleep.  SYMPTOMS   Not feeling rested in the morning.  Anxiety and restlessness at bedtime.  Difficulty falling and staying asleep. TREATMENT   Your caregiver may prescribe treatment for an underlying medical disorders. Your caregiver can give advice or help if you are using alcohol or other  drugs for self-medication. Treatment of underlying problems will usually eliminate insomnia problems.  Medications can be prescribed for short time use. They are generally not recommended for lengthy use.  Over-the-counter sleep medicines are not recommended for lengthy use. They can be habit forming.  You can promote easier sleeping by making lifestyle changes such as:  Using relaxation techniques that help with breathing and reduce muscle tension.  Exercising earlier in the day.  Changing your diet and the time of your last meal. No night time snacks.  Establish a regular time to go to bed.  Counseling can help with stressful problems and worry.  Soothing music and white noise may be helpful if there are background noises you cannot remove.  Stop tedious detailed work at least one hour before bedtime. HOME CARE INSTRUCTIONS   Keep a diary. Inform your caregiver about your progress. This includes any medication side effects. See your caregiver regularly. Take note of:  Times when you are asleep.  Times when you are awake during the night.  The quality of your sleep.  How you feel the next day. This information will help your caregiver care for you.  Get out of bed if you are still awake after 15 minutes. Read or do some quiet activity. Keep the lights down. Wait until you feel sleepy and go back to bed.  Keep regular sleeping and waking hours. Avoid naps.  Exercise regularly.  Avoid distractions at bedtime. Distractions include watching television or engaging in any intense or detailed activity like attempting to balance the household checkbook.  Develop a bedtime ritual. Keep a familiar routine of bathing,  brushing your teeth, climbing into bed at the same time each night, listening to soothing music. Routines increase the success of falling to sleep faster.  Use relaxation techniques. This can be using breathing and muscle tension release routines. It can also include  visualizing peaceful scenes. You can also help control troubling or intruding thoughts by keeping your mind occupied with boring or repetitive thoughts like the old concept of counting sheep. You can make it more creative like imagining planting one beautiful flower after another in your backyard garden.  During your day, work to eliminate stress. When this is not possible use some of the previous suggestions to help reduce the anxiety that accompanies stressful situations. MAKE SURE YOU:   Understand these instructions.  Will watch your condition.  Will get help right away if you are not doing well or get worse. Document Released: 11/28/2000 Document Revised: 02/23/2012 Document Reviewed: 12/29/2007 [facility] Patient Information 2015 [place], [place]. This information is not intended to replace advice given to you by your health care provider. Make sure you discuss any questions you have with your health care provider. Generalized Anxiety Disorder Generalized anxiety disorder (GAD) is a mental disorder. It interferes with life functions, including relationships, work, and school. GAD is different from normal anxiety, which everyone experiences at some point in their lives in response to specific life events and activities. Normal anxiety actually helps [place] prepare for and get through these life events and activities. Normal anxiety goes away after the event or activity is over.  GAD causes anxiety that is not necessarily related to specific events or activities. It also causes excess anxiety in proportion to specific events or activities. The anxiety associated with GAD is also difficult to control. GAD can vary from mild to severe. People with severe GAD can have intense waves of anxiety with physical symptoms (panic attacks).  SYMPTOMS The anxiety and worry associated with GAD are difficult to control. This anxiety and worry are related to many life events and activities and also occur more days  than not for 6 months or longer. People with GAD also have three or more of the following symptoms (one or more in children):  Restlessness.   Fatigue.  Difficulty concentrating.   Irritability.  Muscle tension.  Difficulty sleeping or unsatisfying sleep. DIAGNOSIS GAD is diagnosed through an assessment by your health care provider. Your health care provider will ask you questions aboutyour mood,physical symptoms, and events in your life. Your health care provider may ask you about your medical history and use of alcohol or drugs, including prescription medicines. Your health care provider may also do a physical exam and blood tests. Certain medical conditions and the use of certain substances can cause symptoms similar to those associated with GAD. Your health care provider may refer you to a mental health specialist for further evaluation. TREATMENT The following therapies are usually used to treat GAD:   Medication. Antidepressant medication usually is prescribed for long-term daily control. Antianxiety medicines may be added in severe cases, especially when panic attacks occur.   Talk therapy (psychotherapy). Certain types of talk therapy can be helpful in treating GAD by providing support, education, and guidance. A form of talk therapy called cognitive behavioral therapy can teach you healthy ways to think about and react to daily life events and activities.  Stress managementtechniques. These include yoga, meditation, and exercise and can be very helpful when they are practiced regularly. A mental health specialist can help determine which treatment is best  for you. Some people see improvement with one therapy. However, other people require a combination of therapies. Document Released: 03/28/2013 Document Revised: 04/17/2014 Document Reviewed: 03/28/2013 [facility] Patient Information 2015 [place], [place]. This information is not intended to replace advice given to you by your  health care provider. Make sure you discuss any questions you have with your health care provider.

## 2015-06-28 ENCOUNTER — Telehealth: Payer: Self-pay

## 2015-06-28 NOTE — Telephone Encounter (Signed)
Patient called stating he has been having palpitations and that his heart rate has slowed down or skipped a beat. Patient was already encouraged by Cyndee BrightlyMiel Mock to go to the nearest ER since we had no openings to see. Patient was encouraged to come in next week for a visit but if he has another episode then to go to the ER. Patient agreed.

## 2015-07-02 ENCOUNTER — Ambulatory Visit: Payer: BLUE CROSS/BLUE SHIELD | Admitting: Family Medicine

## 2015-07-04 ENCOUNTER — Ambulatory Visit (INDEPENDENT_AMBULATORY_CARE_PROVIDER_SITE_OTHER): Payer: BLUE CROSS/BLUE SHIELD | Admitting: Family Medicine

## 2015-07-04 ENCOUNTER — Encounter: Payer: Self-pay | Admitting: Family Medicine

## 2015-07-04 VITALS — BP 102/78 | HR 78 | Temp 98.1°F | Resp 16 | Ht 73.5 in | Wt 152.3 lb

## 2015-07-04 DIAGNOSIS — R002 Palpitations: Secondary | ICD-10-CM | POA: Diagnosis not present

## 2015-07-04 DIAGNOSIS — F411 Generalized anxiety disorder: Secondary | ICD-10-CM | POA: Diagnosis not present

## 2015-07-04 NOTE — Patient Instructions (Signed)

## 2015-07-04 NOTE — Progress Notes (Signed)
Name: Lee Sandoval   MRN: [id number]    DOB: Aug 09, 1991   Date:07/04/2015       Progress Note  Subjective  Chief Complaint  Chief Complaint  Patient presents with  . Palpitations    HPI  Palpitations: Patient complains of palpitations, rapid heart beat, shortness of breath and slow heart beat.  Last week he was laying on his couch calm and had just taken his Valium all of a sudden he felt that his heart rate slowed down and stopped, he jumped up suddenly and gasped for a breath and then his heart rate went up and then settled down to normal after a few minutes. The symptoms are of transient, off and on and brief severity, occuring intermittently and lasting a few seconds per episode. Cardiac risk factors include: male gender. Aggravating factors: stress/anxiety. Relieving factors: position change and spontaneous resolution. Associated signs and symptoms: has no complaint(s) of chest pain, claudication, dyspnea, exertional chest pressure/discomfort, lower extremity edema, near-syncope, paroxysmal nocturnal dyspnea and syncope but does have complaint(s) of improved sensation when lying on right side vs left side.    Anxiety: Patient complains of anxiety disorder, panic attacks and sleep disturbance. He has the following symptoms: chest pain, feelings of losing control, insomnia, palpitations, racing thoughts, shortness of breath, sweating. Onset of symptoms was approximately several years ago, gradually worsening since that time. He denies current suicidal and homicidal ideation. Family history significant for anxiety and depression.Possible organic causes contributing are: none. Risk factors: positive family history in mother and negative life event recently his biological father passed away and he attended the funeral where several people had no idea who he was. Recently treatment started Venlafaxine 37.5 twice a day, Valium $RemoveBef'5mg'[id number]$  twice a day as needed, trazodone at bedtime as needed.  He  complains of the following side effects from the treatment: not sure if related to recent palpitations he has had but he has stopped all medication since last week.      Patient Active Problem List   Diagnosis Date Noted  . Cigarette smoker 06/12/2015    History  Substance Use Topics  . Smoking status: Current Every Day Smoker -- 1.00 packs/day    Types: Cigarettes  . Smokeless tobacco: Never Used  . Alcohol Use: 0.0 oz/week    0 Standard drinks or equivalent per week     Comment: occasionally      Current outpatient prescriptions:  .  diazepam (VALIUM) 5 MG tablet, Take 1 tablet (5 mg total) by mouth 2 (two) times daily as needed for anxiety., Disp: 40 tablet, Rfl: 1 .  traZODone (DESYREL) 50 MG tablet, Take 1 tablet (50 mg total) by mouth at bedtime., Disp: 30 tablet, Rfl: 1 .  venlafaxine (EFFEXOR) 37.5 MG tablet, Take 1 tablet (37.5 mg total) by mouth 2 (two) times daily., Disp: 60 tablet, Rfl: 1  Past Surgical History  Procedure Laterality Date  . Tonsillectomy    . Tooth extraction      Family History  Problem Relation Age of Onset  . Heart failure Father     died at age 26  . Depression Mother   . Mental illness Mother     Allergies  Allergen Reactions  . Sulfa Antibiotics Hives    High fever.      Review of Systems  CONSTITUTIONAL: No significant weight changes, fever, chills, weakness or fatigue.  HEENT:  - Eyes: No visual changes.  - Ears: No auditory changes. No pain.  - Nose:  No sneezing, congestion, runny nose. - Throat: No sore throat. No changes in swallowing. SKIN: No rash or itching.  CARDIOVASCULAR: No chest pain, chest pressure or chest discomfort. Yes irregular beat sensation. RESPIRATORY: No shortness of breath, cough or sputum.  GASTROINTESTINAL: No anorexia, nausea, vomiting. No changes in bowel habits. No abdominal pain or blood.  GENITOURINARY: No dysuria. No frequency. No discharge. NEUROLOGICAL: No headache, dizziness,  syncope, paralysis, ataxia, numbness or tingling in the extremities. No memory changes. No change in bowel or bladder control.  MUSCULOSKELETAL: No joint pain. No muscle pain. HEMATOLOGIC: No anemia, bleeding or bruising.  LYMPHATICS: No enlarged lymph nodes.  PSYCHIATRIC: Significant changes. ENDOCRINOLOGIC: No reports of sweating, cold or heat intolerance. No polyuria or polydipsia.   Objective  BP 102/78 mmHg  Pulse 78  Temp(Src) 98.1 F (36.7 C) (Oral)  Resp 16  Ht 6' 1.5" (1.867 m)  Wt 152 lb 4.8 oz (69.083 kg)  BMI 19.82 kg/m2  SpO2 98% Body mass index is 19.82 kg/(m^2).  Physical Exam  Constitutional: Patient appears well-developed and well-nourished. Tall and thin. In no distress.  HEENT:  - Head: Normocephalic and atraumatic.  - Ears: Bilateral TMs gray, no erythema or effusion - Nose: Nasal mucosa moist - Mouth/Throat: Oropharynx is clear and moist. No tonsillar hypertrophy or erythema. No post nasal drainage.  - Eyes: Conjunctivae clear, EOM movements normal. PERRLA. No scleral icterus.  Neck: Normal range of motion. Neck supple. No JVD present. No thyromegaly present.  Cardiovascular: Normal rate, regular rhythm and normal heart sounds while laying on back, sitting, right side and left side. No murmur heard.  Pulmonary/Chest: Effort normal and breath sounds normal. No respiratory distress. Musculoskeletal: Normal range of motion bilateral UE and LE, no joint effusions. Peripheral vascular: Bilateral LE no edema. Neurological: CN II-XII grossly intact with no focal deficits. Alert and oriented to person, place, and time. Coordination, balance, strength, speech and gait are normal.  Skin: Skin is warm and dry. No rash noted. No erythema.  Psychiatric: Patient is expressive, intelligent and communicative. Behavior is normal in office today. Judgment and thought content normal in office today.  Recent Results (from the past 2160 hour(s))  CBC     Status: None    Collection Time: 06/08/15  4:23 PM  Result Value Ref Range   WBC 7.3 3.8 - 10.6 K/uL   RBC 4.97 4.40 - 5.90 MIL/uL   Hemoglobin 14.8 13.0 - 18.0 g/dL   HCT 44.6 40.0 - 52.0 %   MCV 89.8 80.0 - 100.0 fL   MCH 29.9 26.0 - 34.0 pg   MCHC 33.3 32.0 - 36.0 g/dL   RDW 14.4 11.5 - 14.5 %   Platelets 188 150 - 440 K/uL  Comprehensive metabolic panel     Status: None   Collection Time: 06/08/15  4:23 PM  Result Value Ref Range   Sodium 139 135 - 145 mmol/L   Potassium 4.4 3.5 - 5.1 mmol/L   Chloride 104 101 - 111 mmol/L   CO2 27 22 - 32 mmol/L   Glucose, Bld 95 65 - 99 mg/dL   BUN 12 6 - 20 mg/dL   Creatinine, Ser 0.75 0.61 - 1.24 mg/dL   Calcium 9.6 8.9 - 10.3 mg/dL   Total Protein 7.3 6.5 - 8.1 g/dL   Albumin 4.3 3.5 - 5.0 g/dL   AST 19 15 - 41 U/L   ALT 19 17 - 63 U/L   Alkaline Phosphatase 52 38 - 126 U/L  Total Bilirubin 0.5 0.3 - 1.2 mg/dL   GFR calc non Af Amer >60 >60 mL/min   GFR calc Af Amer >60 >60 mL/min    Comment: (NOTE) The eGFR has been calculated using the CKD EPI equation. This calculation has not been validated in all clinical situations. eGFR's persistently <60 mL/min signify possible Chronic Kidney Disease.    Anion gap 8 5 - 15  Troponin I     Status: None   Collection Time: 06/08/15  4:23 PM  Result Value Ref Range   Troponin I <0.03 <0.031 ng/mL    Comment:        NO INDICATION OF MYOCARDIAL INJURY.      Assessment & Plan  1. Intermittent palpitations Intermittent symptoms do not suggest thyroid disorder but this should be checked with lab work. Baseline HR tends to be in the 60s and 3 different EKGs on back, right side, left side laying did not change his HR much. EKG shows T-wave inversions in septal leads as well as questionable atrial structural variance. Clinical exam remains unremarkable but given his ongoing symptoms a Holter Monitor and Echocardiogram may be beneficial to rule out cardiac anomaly. Likely related to ongoing anxiety issues. He  has not been to work since 06/28/15 and is likely taking FMLA until he has cardiac clearance.  - EKG 12-Lead - Ambulatory referral to Cardiology  2. GAD (generalized anxiety disorder) Withhold Venlafaxine, trazodone and valium for now. May use valium 1/2 tablet if having severe anxiety.

## 2015-07-05 ENCOUNTER — Telehealth: Payer: Self-pay | Admitting: *Deleted

## 2015-07-05 ENCOUNTER — Telehealth: Payer: Self-pay

## 2015-07-05 NOTE — Telephone Encounter (Signed)
I called patient to notify him of his appointment next week with [facility] 07-11-14 Dr. Kirke Corin and he asked about FMLA paper work. I advised him, I wasn't sure and would have to have a nurse give him a call back. Please advise. Thanks

## 2015-07-05 NOTE — Telephone Encounter (Signed)
error 

## 2015-07-05 NOTE — Telephone Encounter (Signed)
Patient was informed that he must handle this through his HR Dept and then bring [place] the paperwork that must be completed by Dr. Sherley Bounds. He said ok.

## 2015-07-12 ENCOUNTER — Encounter: Payer: Self-pay | Admitting: Cardiovascular Disease

## 2015-07-12 ENCOUNTER — Encounter (INDEPENDENT_AMBULATORY_CARE_PROVIDER_SITE_OTHER): Payer: Self-pay

## 2015-07-12 ENCOUNTER — Ambulatory Visit (INDEPENDENT_AMBULATORY_CARE_PROVIDER_SITE_OTHER): Payer: BLUE CROSS/BLUE SHIELD

## 2015-07-12 ENCOUNTER — Ambulatory Visit (INDEPENDENT_AMBULATORY_CARE_PROVIDER_SITE_OTHER): Payer: BLUE CROSS/BLUE SHIELD | Admitting: Cardiovascular Disease

## 2015-07-12 VITALS — BP 112/60 | HR 69 | Ht 74.0 in | Wt 152.5 lb

## 2015-07-12 DIAGNOSIS — R0602 Shortness of breath: Secondary | ICD-10-CM

## 2015-07-12 DIAGNOSIS — R002 Palpitations: Secondary | ICD-10-CM | POA: Diagnosis not present

## 2015-07-12 NOTE — Assessment & Plan Note (Signed)
The patient is describing episodes of slowing in his heart rate followed by sudden acceleration which makes him very uncomfortable, anxious and slightly dizzy. This could be due to an exaggerated sinus arrhythmia which is overall benign. However, some other tachyarrhythmia cannot be completely excluded. Thus, I requested a 48-hour Holter monitor. It is highly possible that anxiety is contributing to this. Consider checking thyroid function if not already done.

## 2015-07-12 NOTE — Progress Notes (Signed)
Primary care physician: Dr. Sherley Bounds  HPI  This is a 24 year old man who was referred for evaluation of palpitations and shortness of breath. Patient has no previous cardiac history and is not aware of any congenital heart disease. He thinks his father had myocardial infarction he did not know him very well. He has no chronic medical conditions but does smoke one pack per day. He drinks alcohol occasionally and uses marijuana sometimes. He works as a Research scientist (medical). He suffers from anxiety. He has been having episodes where he feels his heart slows down significantly to the point of pausing Followed by as expected palpitations and tachycardia. This makes him feel dizzy and anxious. He reports no syncope or presyncope. This has been associated with shortness of breath. He reports exertional dyspnea. He also reports intermittent episodes of chest pain especially if he is lying on his left side. He was prescribed diazepam, trazodone and Effexor. However, although he felt very relaxed, he felt sluggish and noticed that his heart was pausing more. Due to that, he stopped taking all 3 medications. The patient has been taken off work until he gets clearance.  Allergies  Allergen Reactions  . Sulfa Antibiotics Hives    High fever.      Current Outpatient Prescriptions on File Prior to Visit  Medication Sig Dispense Refill  . diazepam (VALIUM) 5 MG tablet Take 1 tablet (5 mg total) by mouth 2 (two) times daily as needed for anxiety. 40 tablet 1  . traZODone (DESYREL) 50 MG tablet Take 1 tablet (50 mg total) by mouth at bedtime. (Patient not taking: Reported on 07/12/2015) 30 tablet 1  . venlafaxine (EFFEXOR) 37.5 MG tablet Take 1 tablet (37.5 mg total) by mouth 2 (two) times daily. (Patient not taking: Reported on 07/12/2015) 60 tablet 1   No current facility-administered medications on file prior to visit.     Past Medical History  Diagnosis Date  . Attention deficit hyperactivity disorder (ADHD),  combined type, mild      Past Surgical History  Procedure Laterality Date  . Tonsillectomy    . Tooth extraction       Family History  Problem Relation Age of Onset  . Heart failure Father     died at age 44  . Depression Mother   . Mental illness Mother      History   Social History  . Marital Status: Married    Spouse Name: N/A  . Number of Children: 2  . Years of Education: N/A   Occupational History  . Lab Technician    Social History Main Topics  . Smoking status: Current Every Day Smoker -- 1.00 packs/day    Types: Cigarettes  . Smokeless tobacco: Never Used  . Alcohol Use: 0.0 oz/week    0 Standard drinks or equivalent per week     Comment: occasionally   . Drug Use: No  . Sexual Activity:    Partners: Female   Other Topics Concern  . Not on file   Social History Narrative     ROS A 10 point review of system was performed. It is negative other than that mentioned in the history of present illness.   PHYSICAL EXAM   BP 112/60 mmHg  Pulse 69  Ht 6\' 2"  (1.88 m)  Wt 152 lb 8 oz (69.174 kg)  BMI 19.57 kg/m2 Constitutional: He is oriented to person, place, and time. He appears well-developed and well-nourished. No distress.  HENT: No nasal discharge.  Head: Normocephalic  and atraumatic.  Eyes: Pupils are equal and round.  No discharge. Neck: Normal range of motion. Neck supple. No JVD present. No thyromegaly present.  Cardiovascular: Normal rate, regular rhythm, normal heart sounds. Exam reveals no gallop and no friction rub. No murmur heard.  Pulmonary/Chest: Effort normal and breath sounds normal. No stridor. No respiratory distress. He has no wheezes. He has no rales. He exhibits no tenderness.  Abdominal: Soft. Bowel sounds are normal. He exhibits no distension. There is no tenderness. There is no rebound and no guarding.  Musculoskeletal: Normal range of motion. He exhibits no edema and no tenderness.  Neurological: He is alert and oriented  to person, place, and time. Coordination normal.  Skin: Skin is warm and dry. No rash noted. He is not diaphoretic. No erythema. No pallor.  Psychiatric: He has a normal mood and affect. His behavior is normal. Judgment and thought content normal.       EKG: Sinus  Rhythm  -With rate variation  cv = 10. WITHIN NORMAL LIMITS   ASSESSMENT AND PLAN

## 2015-07-12 NOTE — Patient Instructions (Signed)
Medication Instructions:  Your physician recommends that you continue on your current medications as directed. Please refer to the Current Medication list given to you today.   Labwork: none  Testing/Procedures: Your physician has requested that you have an echocardiogram. Echocardiography is a painless test that uses sound waves to create images of your heart. It provides your doctor with information about the size and shape of your heart and how well your heart's chambers and valves are working. This procedure takes approximately one hour. There are no restrictions for this procedure.  Your physician has recommended that you wear a holter monitor. Holter monitors are medical devices that record the heart's electrical activity. Doctors most often use these monitors to diagnose arrhythmias. Arrhythmias are problems with the speed or rhythm of the heartbeat. The monitor is a small, portable device. You can wear one while you do your normal daily activities. This is usually used to diagnose what is causing palpitations/syncope (passing out).    Follow-Up: Your physician recommends that you schedule a follow-up appointment with Dr. Arida as needed.   Any Other Special Instructions Will Be Listed Below (If Applicable).  Echocardiogram An echocardiogram, or echocardiography, uses sound waves (ultrasound) to produce an image of your heart. The echocardiogram is simple, painless, obtained within a short period of time, and offers valuable information to your health care provider. The images from an echocardiogram can provide information such as:  Evidence of coronary artery disease (CAD).  Heart size.  Heart muscle function.  Heart valve function.  Aneurysm detection.  Evidence of a past heart attack.  Fluid buildup around the heart.  Heart muscle thickening.  Assess heart valve function. LET YOUR HEALTH CARE PROVIDER KNOW ABOUT:  Any allergies you have.  All medicines you are  taking, including vitamins, herbs, eye drops, creams, and over-the-counter medicines.  Previous problems you or members of your family have had with the use of anesthetics.  Any blood disorders you have.  Previous surgeries you have had.  Medical conditions you have.  Possibility of pregnancy, if this applies. BEFORE THE PROCEDURE  No special preparation is needed. Eat and drink normally.  PROCEDURE   In order to produce an image of your heart, gel will be applied to your chest and a wand-like tool (transducer) will be moved over your chest. The gel will help transmit the sound waves from the transducer. The sound waves will harmlessly bounce off your heart to allow the heart images to be captured in real-time motion. These images will then be recorded.  You may need an IV to receive a medicine that improves the quality of the pictures. AFTER THE PROCEDURE You may return to your normal schedule including diet, activities, and medicines, unless your health care provider tells you otherwise. Document Released: 11/28/2000 Document Revised: 04/17/2014 Document Reviewed: 08/08/2013 ExitCare Patient Information 2015 [employer]. This information is not intended to replace advice given to you by your health care provider. Make sure you discuss any questions you have with your health care provider. Cardiac Event Monitoring A cardiac event monitor is a small recording device used to help detect abnormal heart rhythms (arrhythmias). The monitor is used to record heart rhythm when noticeable symptoms such as the following occur:  Fast heartbeats (palpitations), such as heart racing or fluttering.  Dizziness.  Fainting or light-headedness.  Unexplained weakness. The monitor is wired to two electrodes placed on your chest. Electrodes are flat, sticky disks that attach to your skin. The monitor can be worn   for up to 30 days. You will wear the monitor at all times, except when bathing.  HOW TO  USE YOUR CARDIAC EVENT MONITOR A technician will prepare your chest for the electrode placement. The technician will show you how to place the electrodes, how to work the monitor, and how to replace the batteries. Take time to practice using the monitor before you leave the office. Make sure you understand how to send the information from the monitor to your health care provider. This requires a telephone with a landline, not a cell phone. You need to:  Wear your monitor at all times, except when you are in water:  Do not get the monitor wet.  Take the monitor off when bathing. Do not swim or use a hot tub with it on.  Keep your skin clean. Do not put body lotion or moisturizer on your chest.  Change the electrodes daily or any time they stop sticking to your skin. You might need to use tape to keep them on.  It is possible that your skin under the electrodes could become irritated. To keep this from happening, try to put the electrodes in slightly different places on your chest. However, they must remain in the area under your left breast and in the upper right section of your chest.  Make sure the monitor is safely clipped to your clothing or in a location close to your body that your health care provider recommends.  Press the button to record when you feel symptoms of heart trouble, such as dizziness, weakness, light-headedness, palpitations, thumping, shortness of breath, unexplained weakness, or a fluttering or racing heart. The monitor is always on and records what happened slightly before you pressed the button, so do not worry about being too late to get good information.  Keep a diary of your activities, such as walking, doing chores, and taking medicine. It is especially important to note what you were doing when you pushed the button to record your symptoms. This will help your health care provider determine what might be contributing to your symptoms. The information stored in your  monitor will be reviewed by your health care provider alongside your diary entries.  Send the recorded information as recommended by your health care provider. It is important to understand that it will take some time for your health care provider to process the results.  Change the batteries as recommended by your health care provider. SEEK IMMEDIATE MEDICAL CARE IF:   You have chest pain.  You have extreme difficulty breathing or shortness of breath.  You develop a very fast heartbeat that persists.  You develop dizziness that does not go away.  You faint or constantly feel you are about to faint. Document Released: 09/09/2008 Document Revised: 04/17/2014 Document Reviewed: 05/30/2013 ExitCare Patient Information 2015 [employer]. This information is not intended to replace advice given to you by your health care provider. Make sure you discuss any questions you have with your health care provider.  

## 2015-07-12 NOTE — Assessment & Plan Note (Signed)
The patient reports shortness of breath with occasional chest pain especially on his lying on his left side. I requested an echocardiogram to evaluate for any structural or congenital heart abnormalities.

## 2015-07-13 ENCOUNTER — Ambulatory Visit: Payer: BLUE CROSS/BLUE SHIELD | Admitting: Family Medicine

## 2015-07-17 ENCOUNTER — Ambulatory Visit (INDEPENDENT_AMBULATORY_CARE_PROVIDER_SITE_OTHER): Payer: BLUE CROSS/BLUE SHIELD

## 2015-07-17 ENCOUNTER — Other Ambulatory Visit: Payer: Self-pay

## 2015-07-17 DIAGNOSIS — R0602 Shortness of breath: Secondary | ICD-10-CM

## 2015-07-23 ENCOUNTER — Telehealth: Payer: Self-pay

## 2015-07-23 NOTE — Telephone Encounter (Signed)
Pt would like monitor results and echo results. Please call.

## 2015-07-23 NOTE — Telephone Encounter (Signed)
S/w pt to inform him that results need to be reviewed by Dr. Kirke Corin. Pt states he needs answers as soon as possible so that he can return to work. States his company will not let him come back until he has been cleared. Will ask Dr. Kirke Corin to look at results today if possible

## 2015-07-24 NOTE — Telephone Encounter (Signed)
Both echo and Holter monitor were normal. He can resume work with no restrictions.

## 2015-07-25 ENCOUNTER — Telehealth: Payer: Self-pay

## 2015-07-25 NOTE — Telephone Encounter (Signed)
Request from [employer] [facility] , sent to [employer] on 07/25/2015 .

## 2015-07-25 NOTE — Telephone Encounter (Signed)
Left detailed message with results and CB number on VM.

## 2015-07-31 ENCOUNTER — Encounter: Payer: Self-pay | Admitting: Family Medicine

## 2015-07-31 ENCOUNTER — Ambulatory Visit (INDEPENDENT_AMBULATORY_CARE_PROVIDER_SITE_OTHER): Payer: BLUE CROSS/BLUE SHIELD | Admitting: Family Medicine

## 2015-07-31 VITALS — BP 118/66 | HR 81 | Temp 98.0°F | Resp 18 | Wt 155.1 lb

## 2015-07-31 DIAGNOSIS — F411 Generalized anxiety disorder: Secondary | ICD-10-CM

## 2015-07-31 DIAGNOSIS — R002 Palpitations: Secondary | ICD-10-CM | POA: Diagnosis not present

## 2015-07-31 MED ORDER — DIAZEPAM 10 MG PO TABS: 5.0000 mg | ORAL_TABLET | Freq: Every day | ORAL | Status: DC | PRN

## 2015-07-31 MED ORDER — SERTRALINE HCL 25 MG PO TABS: 25.0000 mg | ORAL_TABLET | Freq: Every day | ORAL | Status: DC

## 2015-07-31 NOTE — Progress Notes (Signed)
Name: Lee Sandoval   MRN: [id number]    DOB: 1991-06-22   Date:07/31/2015       Progress Note  Subjective  Chief Complaint  Chief Complaint  Patient presents with  . Medication Dose Change    patient wants to talk about adjusting his dosage.     HPI  Lee Sandoval is a 24 year old male with anxiety who is here to discuss his medications. Patient complains of anxiety disorder, panic attacks and sleep disturbance. He has the following symptoms: chest pain, feelings of losing control, insomnia, palpitations, racing thoughts, shortness of breath, sweating. Onset of symptoms was approximately several years ago, gradually worsening since that time. He denies current suicidal and homicidal ideation. Family history significant for anxiety and depression. Possible organic causes contributing are: none. Risk factors: positive family history in mother and negative life event recently his biological father passed away and he attended the funeral where several people had no idea who he was. Recently treatment started Venlafaxine 37.5 twice a day, Valium 58m twice a day as needed, trazodone at bedtime as needed.  He stopped treatment on his own several weeks ago and underwent cardiology evaluation with Echo and Holter monitor testing which were normal.  He was cleared by cardiology to return to work 07/25/15. He has not returned to work but is planning to starting tomorrow after we discussed treatment options. He does not like how the Venlafaxine 37.56mmade him feel but is needing 1044mf the valium once a day to help keep his anxiety and racing thoughts and paranoia controled. His cousin has tried Prozac and did not like the side effects so MicBarakes not want this medication either.   Patient Active Problem List   Diagnosis Date Noted  . Shortness of breath 07/12/2015  . Intermittent palpitations 07/04/2015  . GAD (generalized anxiety disorder) 07/04/2015  . Cigarette smoker 06/12/2015     Social History  Substance Use Topics  . Smoking status: Current Every Day Smoker -- 1.00 packs/day    Types: Cigarettes  . Smokeless tobacco: Never Used  . Alcohol Use: 0.0 oz/week    0 Standard drinks or equivalent per week     Comment: occasionally      Current outpatient prescriptions:  .  diazepam (VALIUM) 5 MG tablet, Take 1 tablet (5 mg total) by mouth 2 (two) times daily as needed for anxiety., Disp: 40 tablet, Rfl: 1 .  traZODone (DESYREL) 50 MG tablet, Take 1 tablet (50 mg total) by mouth at bedtime. (Patient not taking: Reported on 07/12/2015), Disp: 30 tablet, Rfl: 1 .  venlafaxine (EFFEXOR) 37.5 MG tablet, Take 1 tablet (37.5 mg total) by mouth 2 (two) times daily. (Patient not taking: Reported on 07/12/2015), Disp: 60 tablet, Rfl: 1  Past Surgical History  Procedure Laterality Date  . Tonsillectomy    . Tooth extraction      Family History  Problem Relation Age of Onset  . Heart failure Father     died at age 97 60 Depression Mother   . Mental illness Mother     Allergies  Allergen Reactions  . Sulfa Antibiotics Hives    High fever.      Review of Systems  CONSTITUTIONAL: No significant weight changes, fever, chills, weakness or fatigue.  HEENT:  - Eyes: No visual changes.  - Ears: No auditory changes. No pain.  - Nose: No sneezing, congestion, runny nose. - Throat: No sore throat. No changes in swallowing. SKIN: No rash  or itching.  CARDIOVASCULAR: No chest pain, chest pressure or chest discomfort. No palpitations or edema.  RESPIRATORY: No shortness of breath, cough or sputum.  GASTROINTESTINAL: No anorexia, nausea, vomiting. No changes in bowel habits. No abdominal pain or blood.  GENITOURINARY: No dysuria. No frequency. No discharge.  NEUROLOGICAL: No headache, dizziness, syncope, paralysis, ataxia, numbness or tingling in the extremities. No memory changes. No change in bowel or bladder control.  MUSCULOSKELETAL: No joint pain. No muscle  pain. HEMATOLOGIC: No anemia, bleeding or bruising.  LYMPHATICS: No enlarged lymph nodes.  PSYCHIATRIC: No change in mood. No change in sleep pattern.  ENDOCRINOLOGIC: No reports of sweating, cold or heat intolerance. No polyuria or polydipsia.     Objective  BP 118/66 mmHg  Pulse 81  Temp(Src) 98 F (36.7 C) (Oral)  Resp 18  Wt 155 lb 1.6 oz (70.353 kg)  SpO2 99% Body mass index is 19.91 kg/(m^2).  Physical Exam  Constitutional: Patient appears well-developed and well-nourished. In no distress.  HEENT:  - Head: Normocephalic and atraumatic.  - Ears: Bilateral TMs gray, no erythema or effusion - Nose: Nasal mucosa moist - Mouth/Throat: Oropharynx is clear and moist. No tonsillar hypertrophy or erythema. No post nasal drainage.  - Eyes: Conjunctivae clear, EOM movements normal. PERRLA. No scleral icterus.  Neck: Normal range of motion. Neck supple. No JVD present. No thyromegaly present.  Cardiovascular: Normal rate, regular rhythm and normal heart sounds.  No murmur heard.  Pulmonary/Chest: Effort normal and breath sounds normal. No respiratory distress. Musculoskeletal: Normal range of motion bilateral UE and LE, no joint effusions. Peripheral vascular: Bilateral LE no edema. Neurological: CN II-XII grossly intact with no focal deficits. Alert and oriented to person, place, and time. Coordination, balance, strength, speech and gait are normal.  Skin: Skin is warm and dry. No rash noted. No erythema.  Psychiatric: Patient has an anxious mood and affect. Behavior is normal in office today. Judgment and thought content normal in office today.   Recent Results (from the past 2160 hour(s))  CBC     Status: None   Collection Time: 06/08/15  4:23 PM  Result Value Ref Range   WBC 7.3 3.8 - 10.6 K/uL   RBC 4.97 4.40 - 5.90 MIL/uL   Hemoglobin 14.8 13.0 - 18.0 g/dL   HCT 44.6 40.0 - 52.0 %   MCV 89.8 80.0 - 100.0 fL   MCH 29.9 26.0 - 34.0 pg   MCHC 33.3 32.0 - 36.0 g/dL   RDW  14.4 11.5 - 14.5 %   Platelets 188 150 - 440 K/uL  Comprehensive metabolic panel     Status: None   Collection Time: 06/08/15  4:23 PM  Result Value Ref Range   Sodium 139 135 - 145 mmol/L   Potassium 4.4 3.5 - 5.1 mmol/L   Chloride 104 101 - 111 mmol/L   CO2 27 22 - 32 mmol/L   Glucose, Bld 95 65 - 99 mg/dL   BUN 12 6 - 20 mg/dL   Creatinine, Ser 0.75 0.61 - 1.24 mg/dL   Calcium 9.6 8.9 - 10.3 mg/dL   Total Protein 7.3 6.5 - 8.1 g/dL   Albumin 4.3 3.5 - 5.0 g/dL   AST 19 15 - 41 U/L   ALT 19 17 - 63 U/L   Alkaline Phosphatase 52 38 - 126 U/L   Total Bilirubin 0.5 0.3 - 1.2 mg/dL   GFR calc non Af Amer >60 >60 mL/min   GFR calc Af Amer >60 >60  mL/min    Comment: (NOTE) The eGFR has been calculated using the CKD EPI equation. This calculation has not been validated in all clinical situations. eGFR's persistently <60 mL/min signify possible Chronic Kidney Disease.    Anion gap 8 5 - 15  Troponin I     Status: None   Collection Time: 06/08/15  4:23 PM  Result Value Ref Range   Troponin I <0.03 <0.031 ng/mL    Comment:        NO INDICATION OF MYOCARDIAL INJURY.      Assessment & Plan  1. GAD (generalized anxiety disorder) Highly recommended psychology services, he has a lot of worry and a lot on his mind in general. Would benefit from CBT. He was agreeable to try sticking to Zoloft low dose for 1-2 months at least along with increased dose of Diazepam 40m which he has been instructed to not use daily and to try 1/2 tablet first. I have warned him against becoming dependent on benzodiazepine medications as his mother has a long history of opioid addiction and he may have propensity for dependency.  He voiced understanding and agreement. Short term disability forms requested to be filled out. Total time taken to review cardiology findings, meet with patient, discuss treatment options, execute plan and fill out forms was 40 minutes today.  - diazepam (VALIUM) 10 MG tablet; Take  0.5-1 tablets (5-10 mg total) by mouth daily as needed for anxiety.  Dispense: 30 tablet; Refill: 3 - sertraline (ZOLOFT) 25 MG tablet; Take 1 tablet (25 mg total) by mouth daily.  Dispense: 30 tablet; Refill: 3  2. Intermittent palpitations Cleared by cardiology with negative Holter Monitoring and Echocardiogram.

## 2015-08-01 ENCOUNTER — Telehealth: Payer: Self-pay | Admitting: Family Medicine

## 2015-08-01 ENCOUNTER — Other Ambulatory Visit: Payer: Self-pay | Admitting: Family Medicine

## 2015-08-01 NOTE — Telephone Encounter (Signed)
Letter ready to pick up

## 2015-08-01 NOTE — Telephone Encounter (Signed)
Tried to contact this patient to inform him that his letter was ready for pick up, but there was no answer. A message was left for the patient stating he could come by tomorrow.

## 2015-08-01 NOTE — Telephone Encounter (Signed)
Patient was seen on 07-31-15 and is requesting a letter releasing him back to work. Requesting to pick it up on tomorrow (08-02-15).

## 2015-08-07 ENCOUNTER — Telehealth: Payer: Self-pay | Admitting: Family Medicine

## 2015-08-07 NOTE — Telephone Encounter (Signed)
[employer] called needing dates that pt is supposed to be out of work. Lawanna Kobus is requesting a call back. [phone].

## 2015-08-07 NOTE — Telephone Encounter (Signed)
Contacted Angel and she informed me that she has faxed over a document that needed clarification of why this patient had to stay out of work. I briefly over viewed the note form 07/04/15 and stated we had referred him to Cardiology (Dr. Kirke Corin) and we was not allowed to clear him medically until they did. I also informed her that this patient stated his place of employment did not want him to return until he was cleared due to the area he worked and that he was alone.   She was informed that the paperwork will be completed as as soon as we get it then it will be faxed back. She said thanks.

## 2015-09-13 ENCOUNTER — Ambulatory Visit (INDEPENDENT_AMBULATORY_CARE_PROVIDER_SITE_OTHER): Payer: BLUE CROSS/BLUE SHIELD | Admitting: Family Medicine

## 2015-09-13 ENCOUNTER — Encounter: Payer: Self-pay | Admitting: Family Medicine

## 2015-09-13 VITALS — BP 128/71 | HR 80 | Temp 97.9°F | Resp 18 | Ht 74.0 in | Wt 152.1 lb

## 2015-09-13 DIAGNOSIS — Z114 Encounter for screening for human immunodeficiency virus [HIV]: Secondary | ICD-10-CM | POA: Diagnosis not present

## 2015-09-13 DIAGNOSIS — Z23 Encounter for immunization: Secondary | ICD-10-CM | POA: Diagnosis not present

## 2015-09-13 DIAGNOSIS — K645 Perianal venous thrombosis: Secondary | ICD-10-CM | POA: Diagnosis not present

## 2015-09-13 MED ORDER — HYDROCORTISONE ACETATE 25 MG RE SUPP: 25.0000 mg | Freq: Two times a day (BID) | RECTAL | Status: DC

## 2015-09-13 NOTE — Progress Notes (Signed)
Name: Lee Sandoval   MRN: [id number]    DOB: [birth year]   Date:09/13/2015       Progress Note  Subjective  Chief Complaint  Chief Complaint  Patient presents with  . Hemorrhoids    onset 2 days ago pt denies any straining, constipation, or blood having bowel movements up to 2x a day.  Pt states protruding and painful    HPI  Thrombosed Hemorrhoids: patient states he has normal bowel movements three times daily, three  days ago he was at work and had to bear down to remove a heavy tub. The following day he felt a tender nodule on anal area.  The pain is getting worse since. No blood in stools. The pain is affecting his ability to work and ability to sit down.   Patient Active Problem List   Diagnosis Date Noted  . Shortness of breath 07/12/2015  . Intermittent palpitations 07/04/2015  . GAD (generalized anxiety disorder) 07/04/2015  . Cigarette smoker 06/12/2015  . 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 Clinical depression 08/08/2008  . Obsessive-compulsive disorder 08/08/2008    Past Surgical History  Procedure Laterality Date  . Tonsillectomy    . Tooth extraction      Family History  Problem Relation Age of Onset  . Heart failure Father     died at age 39  . Depression Mother   . Mental illness Mother     Social History   Social History  . Marital Status: Married    Spouse Name: N/A  . Number of Children: 2  . Years of Education: N/A   Occupational History  . Lab Technician    Social History Main Topics  . Smoking status: Current Every Day Smoker -- 1.00 packs/day    Types: Cigarettes  . Smokeless tobacco: Never Used  . Alcohol Use: 0.0 oz/week    0 Standard drinks or equivalent per week     Comment: occasionally   . Drug Use: No  . Sexual Activity:    Partners: Female   Other Topics Concern  . Not on file   Social History Narrative     Current outpatient prescriptions:  .  diazepam (VALIUM) 10 MG tablet, Take 0.5-1 tablets (5-10 mg total) by  mouth daily as needed for anxiety., Disp: 30 tablet, Rfl: 3 .  hydrocortisone (ANUSOL-HC) 25 MG suppository, Place 1 suppository (25 mg total) rectally 2 (two) times daily., Disp: 12 suppository, Rfl: 0 .  sertraline (ZOLOFT) 25 MG tablet, Take 1 tablet (25 mg total) by mouth daily., Disp: 30 tablet, Rfl: 3 .  traZODone (DESYREL) 50 MG tablet, Take 1 tablet by mouth as needed., Disp: , Rfl: 1  Allergies  Allergen Reactions  . Sulfa Antibiotics Hives    High fever.   . Tramadol Hcl      ROS  Constitutional: Negative for fever or weight change.  Respiratory: Negative for cough and shortness of breath.   Cardiovascular: Negative for chest pain or palpitations.  Gastrointestinal: Negative for abdominal pain, no bowel changes.  Musculoskeletal: Negative for gait problem or joint swelling.  Skin: Negative for rash.  Neurological: Negative for dizziness or headache.  No other specific complaints in a complete review of systems (except as listed in HPI above).  Objective  Filed Vitals:   09/13/15 0941  BP: 128/71  Pulse: 80  Temp: 97.9 F (36.6 C)  TempSrc: Oral  Resp: 18  Height:  (1.88 m)  Weight: 152 lb 1.6 oz (68.992 kg)  SpO2: 99%    Body mass index is 19.52 kg/(m^2).  Physical Exam  Constitutional: Patient appears well-developed and well-nourished. Thin. No distress.  HEENT: head atraumatic, normocephalic, pupils equal and reactive to light, neck supple, throat within normal limits Cardiovascular: Normal rate, regular rhythm and normal heart sounds.  No murmur heard. No BLE edema. Pulmonary/Chest: Effort normal and breath sounds normal. No respiratory distress. Abdominal: Soft.  There is no tenderness.  Rectal Exam: small thrombosed and tender hemorrhoid at 2 o'clock Psychiatric: Patient has a normal mood and affect. behavior is normal. Judgment and thought content normal.   Fall Risk: Fall Risk  09/13/2015 06/12/2015  Falls in the past year? No No      Functional Status Survey: Is the patient deaf or have difficulty hearing?: No Does the patient have difficulty seeing, even when wearing glasses/contacts?: No Does the patient have difficulty concentrating, remembering, or making decisions?: No Does the patient have difficulty walking or climbing stairs?: No Does the patient have difficulty dressing or bathing?: No Does the patient have difficulty doing errands alone such as visiting a doctor's office or shopping?: No   Assessment & Plan  1. Thrombosed external hemorrhoid  - hydrocortisone (ANUSOL-HC) 25 MG suppository; Place 1 suppository (25 mg total) rectally 2 (two) times daily.  Dispense: 12 suppository; Refill: 0 - Ambulatory referral to General Surgery  2. Needs flu shot  - Flu Vaccine QUAD 36+ mos PF IM (Fluarix & Fluzone Quad PF) - declined  3. Need for Tdap vaccination  - Tdap vaccine greater than or equal to 7yo IM  4. Encounter for screening for HIV  - HIV antibody

## 2015-09-14 LAB — HIV ANTIBODY (ROUTINE TESTING W REFLEX): HIV SCREEN 4TH GENERATION: NONREACTIVE

## 2015-09-18 ENCOUNTER — Ambulatory Visit: Payer: Self-pay | Admitting: General Surgery

## 2015-09-26 ENCOUNTER — Telehealth: Payer: Self-pay | Admitting: *Deleted

## 2015-09-26 ENCOUNTER — Telehealth: Payer: Self-pay | Admitting: Family Medicine

## 2015-09-26 NOTE — Telephone Encounter (Signed)
Pt is requesting a note to be written for his employee. Pt states he was seen by Dr Carlynn PurlSowles for hemorrids and needs surgery to remove it. He has not been able to have his surgery yet due to waiting to see the surgeon. His employer will not allow him to return to work until his surgery is complete. He had an appt with Dr Birdie SonsByrnette but could not afford to go at that time. He is now waiting to hear back from that office to see if he can get in now. His job is requesting a note until he sees the Careers advisersurgeon. Please advise.

## 2015-09-26 NOTE — Telephone Encounter (Signed)
Patient called and had a appointment on 09/18/15 and canceled it and now wants to be seen soon. He is a old patient of Dr.Byrnetts. He is currently having issues with Thrombosed Hemorrhoids and is in a lot of pain, his work told him he is not able to come back to work until the issue is fixed due to some liability issues. Since Dr.Byrnett will be out of town patient is wanting to be seen soon and was fine with coming in to see you. Can I make the appointment with you?

## 2015-09-26 NOTE — Telephone Encounter (Signed)
i will see the pt

## 2015-09-27 NOTE — Telephone Encounter (Signed)
Pt has an appt with Dr. Sherley BoundsSundaram for 10/01/15.

## 2015-09-27 NOTE — Telephone Encounter (Signed)
I talked to [facility] and she stated patient did not have the co pay at the time of visit to see Dr. Birdie SonsByrnette and now he has the co pay. But Dr. Birdie SonsByrnette is out of the office on vacation and Dr. Evette CristalSankar is the only one in the office and he is wanting a note for work due to not going since his last office note since seeing Dr. Carlynn PurlSowles.

## 2015-09-27 NOTE — Telephone Encounter (Signed)
It has been too long since his visit with us, he needs to see Sherley BoundsSundaram and see if she is willing to write him an excuse for such a long time

## 2015-09-28 ENCOUNTER — Ambulatory Visit (INDEPENDENT_AMBULATORY_CARE_PROVIDER_SITE_OTHER): Payer: BLUE CROSS/BLUE SHIELD | Admitting: Family Medicine

## 2015-09-28 ENCOUNTER — Encounter: Payer: Self-pay | Admitting: Family Medicine

## 2015-09-28 VITALS — BP 124/70 | HR 86 | Temp 98.4°F | Resp 16 | Wt 150.5 lb

## 2015-09-28 DIAGNOSIS — K649 Unspecified hemorrhoids: Secondary | ICD-10-CM | POA: Diagnosis not present

## 2015-09-28 NOTE — Progress Notes (Signed)
Name: York CeriseMichael Allen Hangartner   MRN: [id number]    DOB: 28-Sep-1991   Date:09/28/2015       Progress Note  Subjective  Chief Complaint  Chief Complaint  Patient presents with  . Advice Only    patient was requesting a note that will cover him until he has been cleared by the specialist for his hemorrhoids.    HPI  Patient seen by my colleague while I was out out of the office on 09/13/15 and was diagnosed with mild hemorrhoid, he was given an appointment with surgeon, he missed the appointment as he could not afford the co pay then has another rescheduled appointment 10/02/15 and reports that he hasn't gone to work sine 09/13/15 and wants a letter excusing him from work until his consult with the surgeon. 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 He had to have his surgical consultation and be cleared before returning to work per his HR.    Past Medical History  Diagnosis Date  . Attention deficit hyperactivity disorder (ADHD), combined type, mild     Patient Active Problem List   Diagnosis Date Noted  . Shortness of breath 07/12/2015  . Intermittent palpitations 07/04/2015  . GAD (generalized anxiety disorder) 07/04/2015  . Cigarette smoker 06/12/2015  . 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 Clinical depression 08/08/2008  . Obsessive-compulsive disorder 08/08/2008    Social History  Substance Use Topics  . Smoking status: Current Every Day Smoker -- 1.00 packs/day    Types: Cigarettes  . Smokeless tobacco: Never Used  . Alcohol Use: 0.0 oz/week    0 Standard drinks or equivalent per week     Comment: occasionally      Current outpatient prescriptions:  .  diazepam (VALIUM) 10 MG tablet, Take 0.5-1 tablets (5-10 mg total) by mouth daily as needed for anxiety., Disp: 30 tablet, Rfl: 3 .  hydrocortisone (ANUSOL-HC) 25 MG suppository, Place 1 suppository (25 mg total) rectally 2 (two) times daily., Disp: 12 suppository,  Rfl: 0 .  sertraline (ZOLOFT) 25 MG tablet, Take 1 tablet (25 mg total) by mouth daily., Disp: 30 tablet, Rfl: 3 .  traZODone (DESYREL) 50 MG tablet, Take 1 tablet by mouth as needed., Disp: , Rfl: 1  Allergies  Allergen Reactions  . Sulfa Antibiotics Hives    High fever.   . Tramadol Hcl     Review of Systems  Positive for hemorrhoids as mentioned in HPI, otherwise all systems reviewed and are negative.  Objective  BP 124/70 mmHg  Pulse 86  Temp(Src) 98.4 F (36.9 C) (Oral)  Resp 16  Wt 150 lb 8 oz (68.266 kg)  SpO2 99%  Body mass index is 19.31 kg/(m^2).   Physical Exam  Constitutional: Patient appears well-developed and well-nourished. In no distress.    Psychiatric: Patient has an anxious mood and affect. Behavior is normal in office today. Judgment and thought content normal in office today.    Assessment & Plan  1. Hemorrhoids, unspecified hemorrhoid type Letter provided.

## 2015-10-01 ENCOUNTER — Ambulatory Visit: Payer: BLUE CROSS/BLUE SHIELD | Admitting: Family Medicine

## 2015-10-02 ENCOUNTER — Ambulatory Visit (INDEPENDENT_AMBULATORY_CARE_PROVIDER_SITE_OTHER): Payer: BLUE CROSS/BLUE SHIELD | Admitting: General Surgery

## 2015-10-02 ENCOUNTER — Telehealth: Payer: Self-pay

## 2015-10-02 ENCOUNTER — Encounter: Payer: Self-pay | Admitting: General Surgery

## 2015-10-02 VITALS — BP 120/72 | HR 70 | Resp 12 | Ht 74.0 in | Wt 147.0 lb

## 2015-10-02 DIAGNOSIS — K649 Unspecified hemorrhoids: Secondary | ICD-10-CM

## 2015-10-02 MED ORDER — HYDROCORTISONE ACE-PRAMOXINE 2.5-1 % RE CREA: 1.0000 "application " | TOPICAL_CREAM | Freq: Two times a day (BID) | RECTAL | Status: DC

## 2015-10-02 NOTE — Patient Instructions (Addendum)
Rx send. The patient will be encouraged to make use of a daily fiber supplement.  Patient to call with any questions.

## 2015-10-02 NOTE — Progress Notes (Signed)
Patient ID: Lee Sandoval, male   DOB: 1991/03/23, 24 y.o.   MRN: [id number]  Chief Complaint  Patient presents with  . Other    hemorrhoid    HPI [name] is a 24 y.o. male. here today for a evaluation of a hemorrhoids.He noticed this about three weeks ago. He states he felt a knot. Patient states when he moves his  bowel very painful and no bleeding.                                                                                      HPI  Past Medical History  Diagnosis Date  . Attention deficit hyperactivity disorder (ADHD), combined type, mild     Past Surgical History  Procedure Laterality Date  . Tonsillectomy    . Tooth extraction      Family History  Problem Relation Age of Onset  . Heart failure Father     died at age 31  . Depression Mother   . Mental illness Mother     Social History Social History  Substance Use Topics  . Smoking status: Current Every Day Smoker -- 1.00 packs/day    Types: Cigarettes  . Smokeless tobacco: Never Used  . Alcohol Use: 0.0 oz/week    0 Standard drinks or equivalent per week     Comment: occasionally     Allergies  Allergen Reactions  . Sulfa Antibiotics Hives    High fever.   . Tramadol Hcl     Current Outpatient Prescriptions  Medication Sig Dispense Refill  . diazepam (VALIUM) 10 MG tablet Take 0.5-1 tablets (5-10 mg total) by mouth daily as needed for anxiety. 30 tablet 3  . hydrocortisone (ANUSOL-HC) 25 MG suppository Place 1 suppository (25 mg total) rectally 2 (two) times daily. 12 suppository 0  . hydrocortisone-pramoxine (ANALPRAM HC) 2.5-1 % rectal cream Place 1 application rectally 2 (two) times daily. 30 g 1  . sertraline (ZOLOFT) 25 MG tablet Take 1 tablet (25 mg total) by mouth daily. 30 tablet 3  . traZODone (DESYREL) 50 MG tablet Take 1 tablet by mouth as needed.  1   No current facility-administered medications for this visit.    Review of Systems Review of Systems  Constitutional:  Negative.   Respiratory: Negative.   Cardiovascular: Negative.     Blood pressure 120/72, pulse 70, resp. rate 12, height  (1.88 m), weight 147 lb (66.679 kg).  Physical Exam Physical Exam  Constitutional: He is oriented to person, place, and time. He appears well-developed and well-nourished.  Eyes: Conjunctivae are normal.  Neck: Neck supple.  Abdominal: Soft. Bowel sounds are normal. There is no tenderness.  Genitourinary: Rectal exam shows internal hemorrhoid ( 1-2 cm, not prolapsing).  Lymphadenopathy:    He has no cervical adenopathy.  Neurological: He is alert and oriented to person, place, and time.  Skin: Skin is warm and dry.    Data Reviewed   Assessment    Small internal hemorrhoid. By history this has improved since last wek.     Plan   Rx -Analpram cream 2.5% to use bid.F/U prn. Patient to return  PCP:  Sela HuaSundaram  Violanda Bobeck G 10/03/2015, 7:46 AM

## 2015-10-02 NOTE — Telephone Encounter (Signed)
Received records request [employer] , [facility].

## 2015-10-03 ENCOUNTER — Encounter: Payer: Self-pay | Admitting: General Surgery

## 2015-10-15 ENCOUNTER — Ambulatory Visit: Payer: Self-pay | Admitting: General Surgery

## 2015-10-31 ENCOUNTER — Ambulatory Visit: Payer: BLUE CROSS/BLUE SHIELD | Admitting: Family Medicine

## 2015-11-21 ENCOUNTER — Encounter: Payer: Self-pay | Admitting: Family Medicine

## 2015-11-21 ENCOUNTER — Ambulatory Visit (INDEPENDENT_AMBULATORY_CARE_PROVIDER_SITE_OTHER): Payer: Self-pay | Admitting: Family Medicine

## 2015-11-21 VITALS — BP 106/68 | HR 102 | Temp 98.2°F | Resp 16 | Ht 74.0 in | Wt 148.0 lb

## 2015-11-21 DIAGNOSIS — R0781 Pleurodynia: Secondary | ICD-10-CM

## 2015-11-21 DIAGNOSIS — R413 Other amnesia: Secondary | ICD-10-CM | POA: Insufficient documentation

## 2015-11-21 DIAGNOSIS — F411 Generalized anxiety disorder: Secondary | ICD-10-CM

## 2015-11-21 MED ORDER — DIAZEPAM 10 MG PO TABS: 5.0000 mg | ORAL_TABLET | Freq: Every day | ORAL | Status: DC | PRN

## 2015-11-21 MED ORDER — OXYCODONE-ACETAMINOPHEN 7.5-325 MG PO TABS: 1.0000 | ORAL_TABLET | Freq: Three times a day (TID) | ORAL | Status: DC | PRN
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
## 2015-11-23 IMAGING — CR DG CHEST 2V
2 series · 2 of 2 positions shown · non-contrast
Comparison: November 12, 2013

CLINICAL DATA: One day history of left-sided chest pain and
difficulty breathing

EXAM:
CHEST  2 VIEW

[w chest pa]
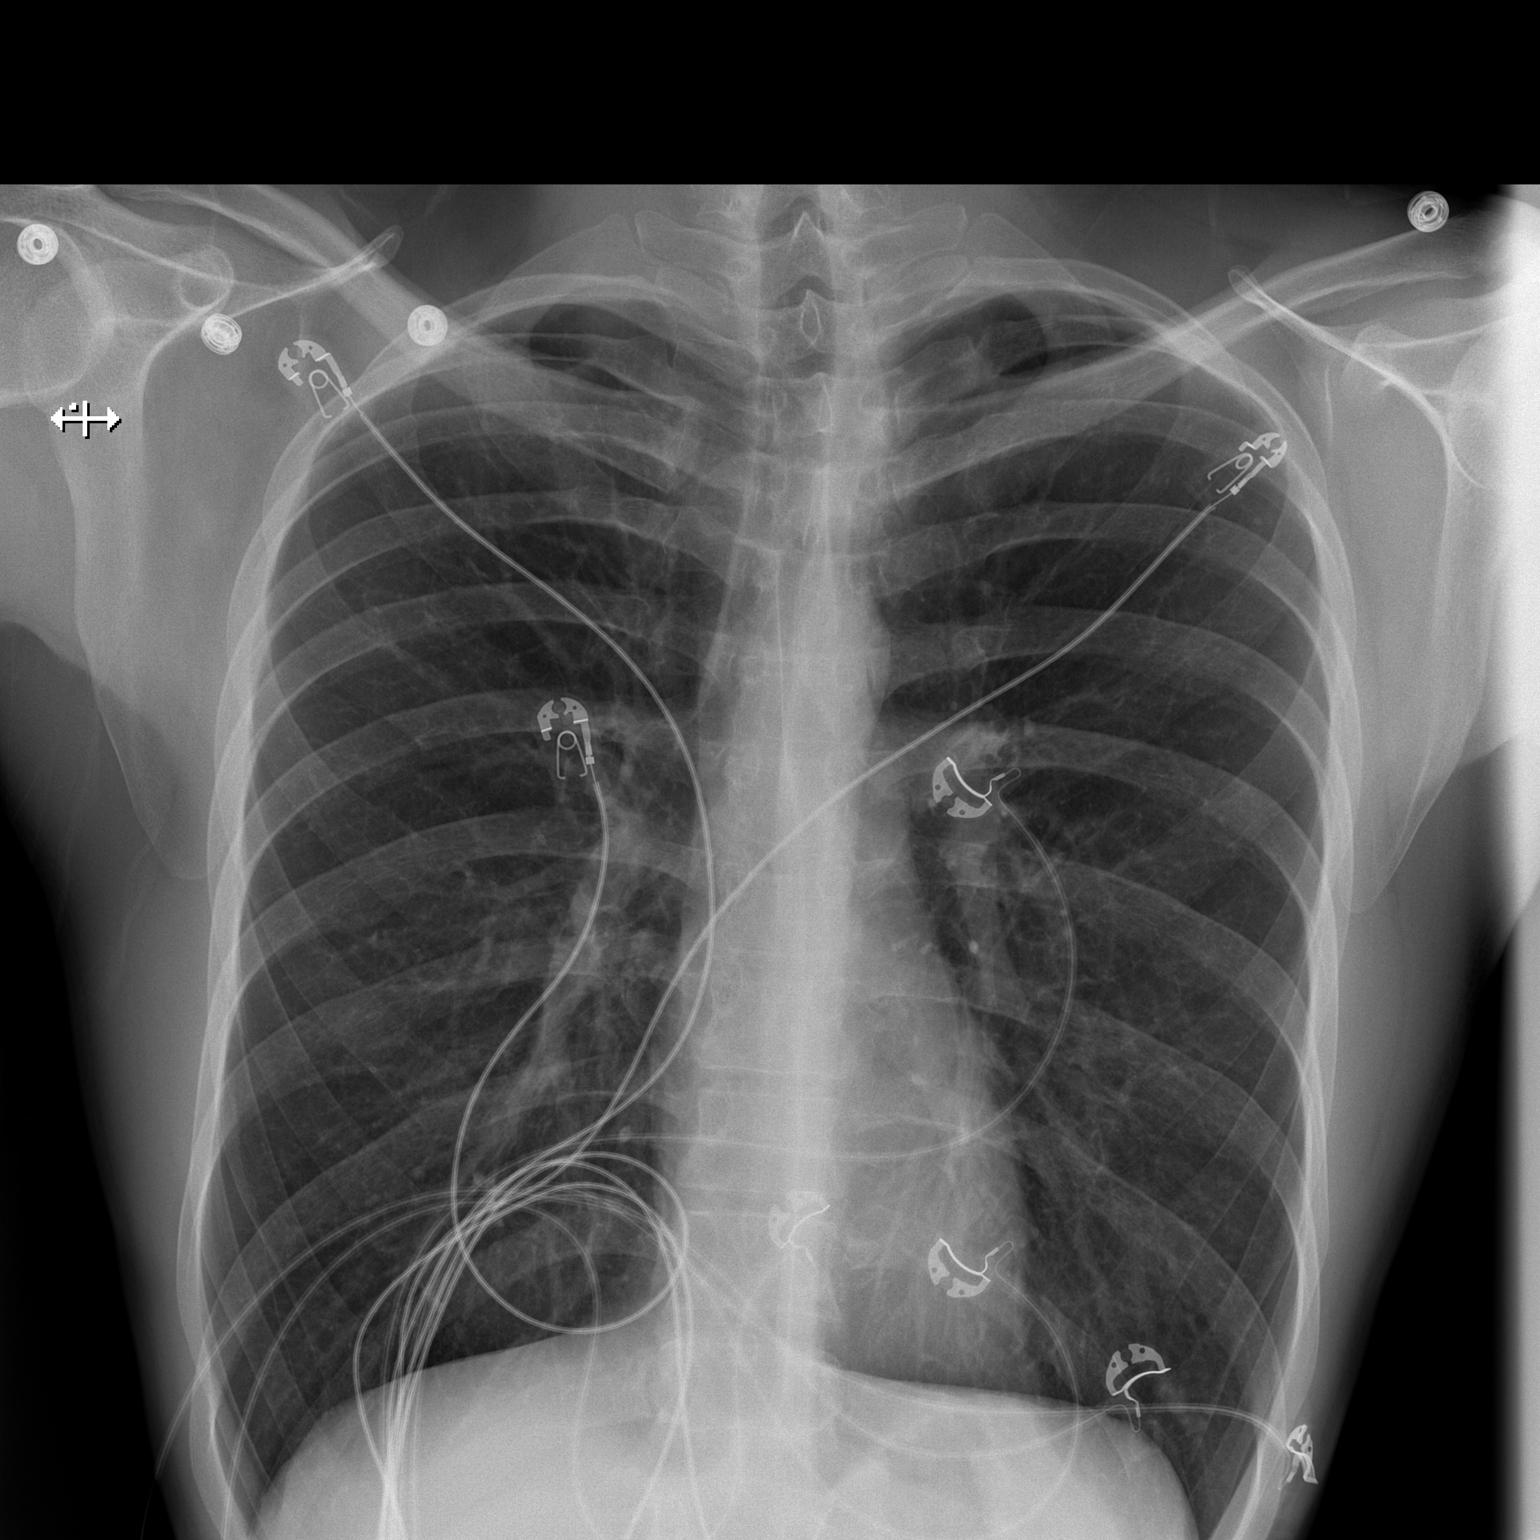

[w chest lat]
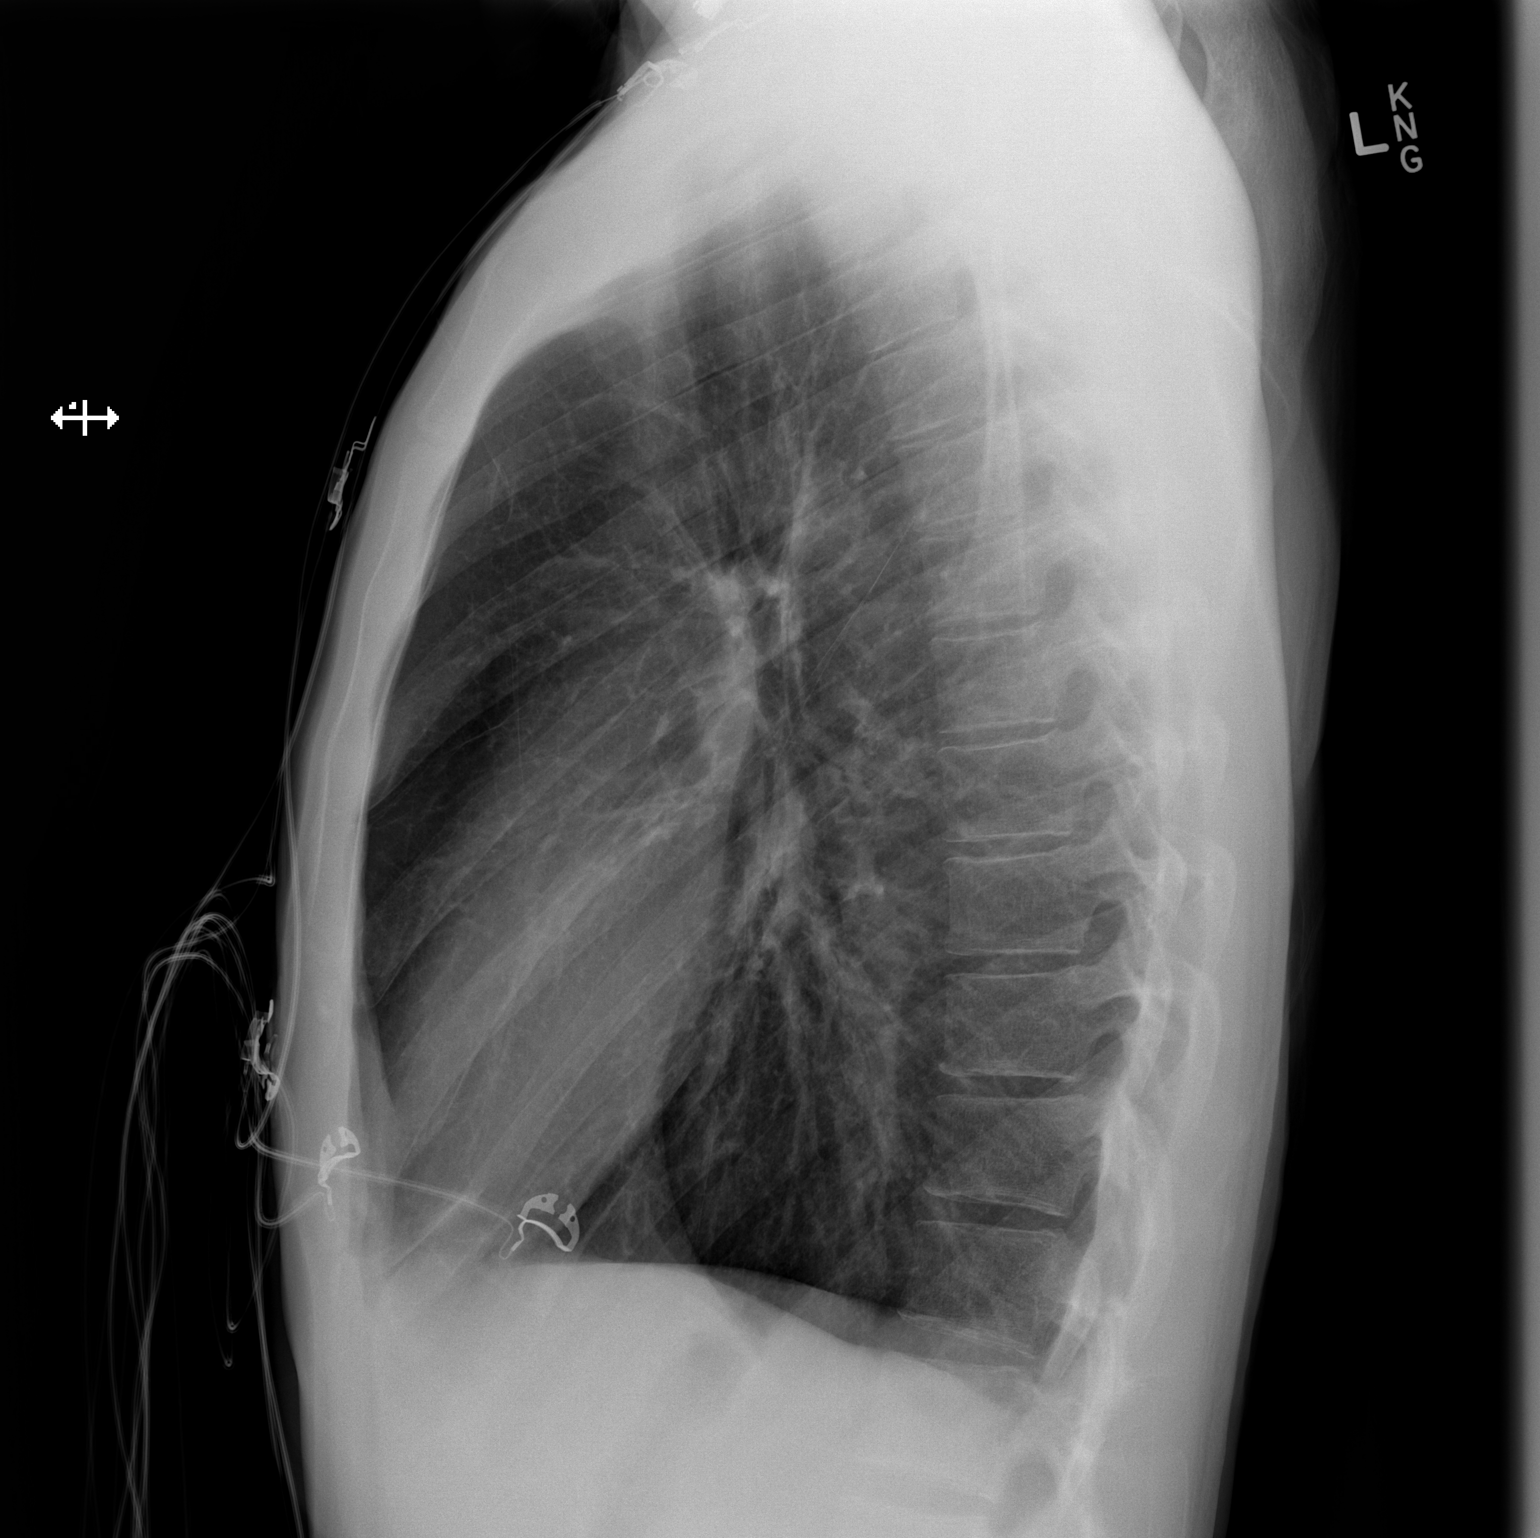

[2 of 2 positions shown; findings below may reference images not displayed]

FINDINGS: The lungs are clear. Heart size and pulmonary vascularity are
normal. No adenopathy. No pneumothorax. No bone lesions.
IMPRESSION: No edema or consolidation.

## 2015-11-25 ENCOUNTER — Emergency Department: Payer: BLUE CROSS/BLUE SHIELD

## 2015-11-25 ENCOUNTER — Encounter: Payer: Self-pay | Admitting: Emergency Medicine

## 2015-11-25 ENCOUNTER — Emergency Department
Admission: EM | Admit: 2015-11-25 | Discharge: 2015-11-25 | Disposition: A | Discharge status: Home / Self Care | Payer: BLUE CROSS/BLUE SHIELD | Attending: Emergency Medicine | Admitting: Emergency Medicine

## 2015-11-25 DIAGNOSIS — S060X9A Concussion with loss of consciousness of unspecified duration, initial encounter: Secondary | ICD-10-CM | POA: Diagnosis not present

## 2015-11-25 DIAGNOSIS — Y92410 Unspecified street and highway as the place of occurrence of the external cause: Secondary | ICD-10-CM | POA: Diagnosis not present

## 2015-11-25 DIAGNOSIS — S20211A Contusion of right front wall of thorax, initial encounter: Secondary | ICD-10-CM | POA: Diagnosis not present

## 2015-11-25 DIAGNOSIS — S0990XA Unspecified injury of head, initial encounter: Secondary | ICD-10-CM | POA: Diagnosis present

## 2015-11-25 DIAGNOSIS — Y9389 Activity, other specified: Secondary | ICD-10-CM | POA: Diagnosis not present

## 2015-11-25 DIAGNOSIS — Y998 Other external cause status: Secondary | ICD-10-CM | POA: Insufficient documentation

## 2015-11-25 DIAGNOSIS — F17210 Nicotine dependence, cigarettes, uncomplicated: Secondary | ICD-10-CM | POA: Diagnosis not present

## 2015-11-25 MED ORDER — IBUPROFEN 800 MG PO TABS: 800.0000 mg | ORAL_TABLET | Freq: Three times a day (TID) | ORAL | Status: DC | PRN

## 2015-11-25 MED ORDER — CYCLOBENZAPRINE HCL 10 MG PO TABS: 10.0000 mg | ORAL_TABLET | Freq: Three times a day (TID) | ORAL | Status: DC | PRN

## 2015-11-25 MED ORDER — DIAZEPAM 5 MG PO TABS: 5.0000 mg | ORAL_TABLET | Freq: Three times a day (TID) | ORAL | Status: DC | PRN

## 2015-11-25 MED ORDER — OXYCODONE-ACETAMINOPHEN 5-325 MG PO TABS
2.0000 | ORAL_TABLET | Freq: Once | ORAL | Status: AC
  Administered 2015-11-25: 2 via ORAL
  Filled 2015-11-25: qty 2
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
## 2015-11-28 ENCOUNTER — Ambulatory Visit: Payer: BLUE CROSS/BLUE SHIELD | Admitting: Family Medicine

## 2015-11-29 ENCOUNTER — Telehealth: Payer: Self-pay | Admitting: Family Medicine

## 2015-11-29 ENCOUNTER — Ambulatory Visit (INDEPENDENT_AMBULATORY_CARE_PROVIDER_SITE_OTHER): Payer: Self-pay | Admitting: Family Medicine

## 2015-11-29 ENCOUNTER — Encounter: Payer: Self-pay | Admitting: Family Medicine

## 2015-11-29 ENCOUNTER — Other Ambulatory Visit: Payer: Self-pay

## 2015-11-29 VITALS — BP 154/82 | HR 84 | Temp 97.6°F | Resp 18 | Ht 74.0 in | Wt 148.3 lb

## 2015-11-29 DIAGNOSIS — R0781 Pleurodynia: Secondary | ICD-10-CM

## 2015-11-29 DIAGNOSIS — F411 Generalized anxiety disorder: Secondary | ICD-10-CM

## 2015-11-29 DIAGNOSIS — S060X0D Concussion without loss of consciousness, subsequent encounter: Secondary | ICD-10-CM

## 2015-11-29 MED ORDER — OXYCODONE-ACETAMINOPHEN 7.5-325 MG PO TABS: 8.0000 | ORAL_TABLET | Freq: Three times a day (TID) | ORAL | Status: DC | PRN

## 2015-11-29 MED ORDER — OXYCODONE-ACETAMINOPHEN 7.5-325 MG PO TABS: 1.0000 | ORAL_TABLET | Freq: Three times a day (TID) | ORAL | Status: DC | PRN
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
## 2015-11-30 NOTE — Telephone Encounter (Signed)
He needs to be seen by Dr. Sherley BoundsSundaram, I cannot give him a not for work, he should have called us sooner.

## 2015-11-30 NOTE — Telephone Encounter (Signed)
Pt was seen on 11/29/15 by Dr Thana AtesMorrisey

## 2015-12-03 NOTE — Telephone Encounter (Signed)
Pt seen today by dr Thana Atesmorrisey

## 2015-12-04 ENCOUNTER — Encounter: Payer: Self-pay | Admitting: Family Medicine

## 2015-12-04 ENCOUNTER — Ambulatory Visit (INDEPENDENT_AMBULATORY_CARE_PROVIDER_SITE_OTHER): Payer: Self-pay | Admitting: Family Medicine

## 2015-12-04 VITALS — HR 103 | Temp 97.8°F | Resp 17 | Ht 74.0 in | Wt 147.6 lb

## 2015-12-04 DIAGNOSIS — R0781 Pleurodynia: Secondary | ICD-10-CM

## 2015-12-04 DIAGNOSIS — S0993XA Unspecified injury of face, initial encounter: Secondary | ICD-10-CM | POA: Insufficient documentation

## 2015-12-04 DIAGNOSIS — S0993XS Unspecified injury of face, sequela: Secondary | ICD-10-CM

## 2015-12-04 DIAGNOSIS — S060X0D Concussion without loss of consciousness, subsequent encounter: Secondary | ICD-10-CM

## 2015-12-04 DIAGNOSIS — S060X0A Concussion without loss of consciousness, initial encounter: Secondary | ICD-10-CM | POA: Insufficient documentation

## 2015-12-04 DIAGNOSIS — R413 Other amnesia: Secondary | ICD-10-CM

## 2015-12-04 MED ORDER — OXYCODONE-ACETAMINOPHEN 5-325 MG PO TABS: 1.0000 | ORAL_TABLET | Freq: Three times a day (TID) | ORAL | Status: DC | PRN

## 2015-12-04 NOTE — Patient Instructions (Signed)
Linear Scleroderma
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
## 2015-12-14 ENCOUNTER — Telehealth: Payer: Self-pay

## 2015-12-14 NOTE — Telephone Encounter (Signed)
Got a call from [employer] requesting bills and medical records for this patient. She stated that she has already faxed a request but has not gotten a response. I told the lady to fax the request to our office and that our office manager will make sure it gets to the appropriate person. Our office number was given. She said that she would and said thanks.

## 2015-12-18 ENCOUNTER — Encounter: Payer: Self-pay | Admitting: Family Medicine

## 2015-12-18 ENCOUNTER — Ambulatory Visit (INDEPENDENT_AMBULATORY_CARE_PROVIDER_SITE_OTHER): Payer: Self-pay | Admitting: Family Medicine

## 2015-12-18 ENCOUNTER — Ambulatory Visit: Payer: BLUE CROSS/BLUE SHIELD | Admitting: Family Medicine

## 2015-12-18 VITALS — BP 122/70 | HR 93 | Temp 98.2°F | Resp 16 | Ht 74.0 in | Wt 150.9 lb

## 2015-12-18 DIAGNOSIS — S060X0S Concussion without loss of consciousness, sequela: Secondary | ICD-10-CM
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
## 2016-02-17 ENCOUNTER — Emergency Department
Admission: EM | Admit: 2016-02-17 | Discharge: 2016-02-17 | Disposition: A | Discharge status: Home / Self Care | Payer: BLUE CROSS/BLUE SHIELD | Attending: Student | Admitting: Student

## 2016-02-17 ENCOUNTER — Encounter: Payer: Self-pay | Admitting: Emergency Medicine

## 2016-02-17 DIAGNOSIS — R509 Fever, unspecified: Secondary | ICD-10-CM | POA: Diagnosis not present

## 2016-02-17 DIAGNOSIS — R1111 Vomiting without nausea: Secondary | ICD-10-CM | POA: Insufficient documentation

## 2016-02-17 DIAGNOSIS — J029 Acute pharyngitis, unspecified: Secondary | ICD-10-CM | POA: Insufficient documentation

## 2016-02-17 DIAGNOSIS — Z79899 Other long term (current) drug therapy: Secondary | ICD-10-CM | POA: Insufficient documentation

## 2016-02-17 DIAGNOSIS — R6889 Other general symptoms and signs: Secondary | ICD-10-CM

## 2016-02-17 DIAGNOSIS — R112 Nausea with vomiting, unspecified: Secondary | ICD-10-CM | POA: Diagnosis present

## 2016-02-17 DIAGNOSIS — F17210 Nicotine dependence, cigarettes, uncomplicated: Secondary | ICD-10-CM | POA: Insufficient documentation

## 2016-02-17 HISTORY — DX: Generalized anxiety disorder: F41.1

## 2016-02-17 LAB — CBC WITH DIFFERENTIAL/PLATELET
BASOS ABS: 0 10*3/uL (ref 0–0.1)
BASOS PCT: 0 %
EOS ABS: 0 10*3/uL (ref 0–0.7)
EOS PCT: 0 %
HEMATOCRIT: 42.9 % (ref 40.0–52.0)
Hemoglobin: 14.5 g/dL (ref 13.0–18.0)
Lymphocytes Relative: 8 %
Lymphs Abs: 1.3 10*3/uL (ref 1.0–3.6)
MCH: 29.8 pg (ref 26.0–34.0)
MCHC: 33.8 g/dL (ref 32.0–36.0)
MCV: 88 fL (ref 80.0–100.0)
MONO ABS: 1.6 10*3/uL — AB (ref 0.2–1.0)
Monocytes Relative: 9 %
NEUTROS ABS: 13.9 10*3/uL — AB (ref 1.4–6.5)
Neutrophils Relative %: 83 %
PLATELETS: 164 10*3/uL (ref 150–440)
RBC: 4.87 MIL/uL (ref 4.40–5.90)
RDW: 13.9 % (ref 11.5–14.5)
WBC: 16.9 10*3/uL — AB (ref 3.8–10.6)

## 2016-02-17 LAB — COMPREHENSIVE METABOLIC PANEL
ALBUMIN: 4.4 g/dL (ref 3.5–5.0)
ALT: 16 U/L — ABNORMAL LOW (ref 17–63)
AST: 15 U/L (ref 15–41)
Alkaline Phosphatase: 53 U/L (ref 38–126)
Anion gap: 9 (ref 5–15)
BUN: 8 mg/dL (ref 6–20)
CHLORIDE: 104 mmol/L (ref 101–111)
CO2: 25 mmol/L (ref 22–32)
Calcium: 9 mg/dL (ref 8.9–10.3)
Creatinine, Ser: 0.68 mg/dL (ref 0.61–1.24)
GFR calc Af Amer: >60 mL/min (ref >60)
GFR calc non Af Amer: >60 mL/min (ref >60)
GLUCOSE: 112 mg/dL — AB (ref 65–99)
POTASSIUM: 3.9 mmol/L (ref 3.5–5.1)
SODIUM: 138 mmol/L (ref 135–145)
Total Bilirubin: 0.5 mg/dL (ref 0.3–1.2)
Total Protein: 7.1 g/dL (ref 6.5–8.1)

## 2016-02-17 LAB — POCT RAPID STREP A: Streptococcus, Group A Screen (Direct): NEGATIVE

## 2016-02-17 LAB — LIPASE, BLOOD: Lipase: 12 U/L (ref 11–51)

## 2016-02-17 MED ORDER — SODIUM CHLORIDE 0.9 % IV BOLUS (SEPSIS)
1000.0000 mL | Freq: Once | INTRAVENOUS | Status: AC
  Administered 2016-02-17: 1000 mL via INTRAVENOUS

## 2016-02-17 MED ORDER — PROMETHAZINE HCL 12.5 MG PO TABS: 12.5000 mg | ORAL_TABLET | Freq: Four times a day (QID) | ORAL | Status: DC | PRN

## 2016-02-17 MED ORDER — OSELTAMIVIR PHOSPHATE 75 MG PO CAPS: 75.0000 mg | ORAL_CAPSULE | Freq: Two times a day (BID) | ORAL | Status: AC

## 2016-02-17 MED ORDER — ONDANSETRON HCL 4 MG/2ML IJ SOLN
4.0000 mg | Freq: Once | INTRAMUSCULAR | Status: AC
  Administered 2016-02-17: 4 mg via INTRAVENOUS
  Filled 2016-02-17: qty 2
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
## 2016-02-17 NOTE — ED Notes (Signed)
Patient drove himself to the ED.

## 2016-02-17 NOTE — ED Notes (Signed)
Patient reports since Saturday morning with fever (102 at home) and vomiting.

## 2016-02-19 LAB — CULTURE, GROUP A STREP (THRC)

## 2016-05-23 ENCOUNTER — Emergency Department (HOSPITAL_BASED_OUTPATIENT_CLINIC_OR_DEPARTMENT_OTHER)
Admit: 2016-05-23 | Discharge: 2016-05-23 | Disposition: A | Discharge status: Home / Self Care | Payer: BLUE CROSS/BLUE SHIELD | Attending: Emergency Medicine | Admitting: Emergency Medicine

## 2016-05-23 ENCOUNTER — Emergency Department (HOSPITAL_COMMUNITY)
Admission: EM | Admit: 2016-05-23 | Discharge: 2016-05-23 | Disposition: A | Discharge status: Home / Self Care | Payer: BLUE CROSS/BLUE SHIELD | Attending: Emergency Medicine | Admitting: Emergency Medicine

## 2016-05-23 ENCOUNTER — Encounter (HOSPITAL_COMMUNITY): Payer: Self-pay | Admitting: Emergency Medicine

## 2016-05-23 DIAGNOSIS — F17210 Nicotine dependence, cigarettes, uncomplicated: Secondary | ICD-10-CM | POA: Diagnosis not present

## 2016-05-23 DIAGNOSIS — F902 Attention-deficit hyperactivity disorder, combined type: Secondary | ICD-10-CM | POA: Insufficient documentation

## 2016-05-23 DIAGNOSIS — M79609 Pain in unspecified limb: Secondary | ICD-10-CM

## 2016-05-23 DIAGNOSIS — M79605 Pain in left leg: Secondary | ICD-10-CM

## 2016-05-23 DIAGNOSIS — M79662 Pain in left lower leg: Secondary | ICD-10-CM | POA: Insufficient documentation

## 2016-05-23 LAB — POTASSIUM: POTASSIUM: 4.1 mmol/L (ref 3.5–5.1)

## 2016-05-23 MED ORDER — METHOCARBAMOL 500 MG PO TABS: 500.0000 mg | ORAL_TABLET | Freq: Two times a day (BID) | ORAL | Status: DC

## 2016-05-23 MED ORDER — NAPROXEN 500 MG PO TABS: 500.0000 mg | ORAL_TABLET | Freq: Two times a day (BID) | ORAL | Status: DC

## 2016-05-23 NOTE — ED Provider Notes (Signed)
CSN: [id number]     Arrival date & time 05/23/16  1404 History   First MD Initiated Contact with Patient 05/23/16 1427     Chief Complaint  Patient presents with  . Leg Pain     (Consider location/radiation/quality/duration/timing/severity/associated sxs/prior Treatment) Patient is a 25 y.o. male presenting with leg pain. The history is provided by the patient and medical records.  Leg Pain   25 y.o. M with hx of anxiety, ADHD, presenting to the ED for left leg pain.  Patient states for the past week he has had intermittent cramps and pulsating pain in his left proximal calf.  He states it has been worsening over the past week.  Patient does admit working third shift and standing for long periods of time.  States pain is worse with standing/walking/exertion.  Denies known injury/trauma.  Mom has hx of DVT and PE, unknown setting.  Patient has no hx of clots.  No recent illness, fever, or chills.   States he has hx of calf pain in the past and reports he was evaluated in the ER with negative work-up.    Past Medical History  Diagnosis Date  . Attention deficit hyperactivity disorder (ADHD), combined type, mild   . Generalized anxiety disorder    Past Surgical History  Procedure Laterality Date  . Tonsillectomy    . Tooth extraction     Family History  Problem Relation Age of Onset  . Heart failure Father     died at age 27  . Depression Mother   . Mental illness Mother    Social History  Substance Use Topics  . Smoking status: Current Every Day Smoker -- 1.00 packs/day    Types: Cigarettes  . Smokeless tobacco: Never Used  . Alcohol Use: 0.0 oz/week    0 Standard drinks or equivalent per week     Comment: occasionally     Review of Systems  Musculoskeletal: Positive for arthralgias.  All other systems reviewed and are negative.     Allergies  Sulfa antibiotics and Tramadol hcl  Home Medications   Prior to Admission medications   Medication Sig Start Date End Date  Taking? Authorizing Provider  diazepam (VALIUM) 10 MG tablet Take 0.5-1 tablets (5-10 mg total) by mouth daily as needed for anxiety. 11/21/15   Edwena FeltyAshany Sundaram, MD  hydrocortisone (ANUSOL-HC) 25 MG suppository Place 1 suppository (25 mg total) rectally 2 (two) times daily. 09/13/15   Alba CoryKrichna Sowles, MD  hydrocortisone-pramoxine (ANALPRAM HC) 2.5-1 % rectal cream Place 1 application rectally 2 (two) times daily. 10/02/15   Seeplaputhur Wynona LunaG Sankar, MD  ibuprofen (ADVIL,MOTRIN) 800 MG tablet Take 1 tablet (800 mg total) by mouth every 8 (eight) hours as needed. Patient not taking: Reported on 12/04/2015 11/25/15   Emily FilbertJonathan E Williams, MD  promethazine (PHENERGAN) 12.5 MG tablet Take 1 tablet (12.5 mg total) by mouth every 6 (six) hours as needed for nausea or vomiting. 02/17/16   Gayla DossEryka A Gayle, MD  traZODone (DESYREL) 50 MG tablet Take 1 tablet by mouth as needed. Reported on 12/04/2015 07/13/15   Historical Provider, MD   BP 134/78 mmHg  Pulse 83  Temp(Src) 98 F (36.7 C) (Oral)  Resp 16  SpO2 100%   Physical Exam  Constitutional: He is oriented to person, place, and time. He appears well-developed and well-nourished. No distress.  HENT:  Head: Normocephalic and atraumatic.  Mouth/Throat: Oropharynx is clear and moist.  Eyes: Conjunctivae and EOM are normal. Pupils are equal, round, and reactive  to light.  Neck: Normal range of motion. Neck supple.  Cardiovascular: Normal rate, regular rhythm and normal heart sounds.   Pulmonary/Chest: Effort normal and breath sounds normal. No respiratory distress. He has no wheezes.  Abdominal: Soft. Bowel sounds are normal. There is no tenderness. There is no guarding.  Musculoskeletal: Normal range of motion. He exhibits no edema.  Left proximal calf mildly TTP; no swelling, overlying erythema, or warmth to touch; no varicosities; DP pulse intact; ambulatory  Neurological: He is alert and oriented to person, place, and time.  Skin: Skin is warm and dry. He  is not diaphoretic.  Psychiatric: He has a normal mood and affect.  Nursing note and vitals reviewed.   ED Course  Procedures (including critical care time) Labs Review Labs Reviewed  I-STAT CHEM 8, ED - Abnormal; Notable for the following:    Potassium 7.5 (*)    Glucose, Bld 107 (*)    Calcium, Ion 1.09 (*)    All other components within normal limits  POTASSIUM    Imaging Review No results found.   VASCULAR LAB PRELIMINARY PRELIMINARY PRELIMINARY PRELIMINARY  Left lower extremity venous duplex completed.   Preliminary report: Left: No evidence of DVT, superficial thrombosis, or Baker's cyst.  Gave results to patient's nurse.  Jenetta Loges, RVT, RDMS 05/23/2016, 4:19 PM  I have personally reviewed and evaluated these images and lab results as part of my medical decision-making.   EKG Interpretation None      MDM   Final diagnoses:  Left leg pain   25 year old male here with left calf pain intermittently for the past week. Worse with standing and walking. Does admit to working night shift and stands for several hours at a time. Denies recent injury.  There is no swelling or bony deformity noted on exam. No skin changes suggestive of cellulitis or other skin infection.  His leg is neurovascularly intact. He is ambulatory with steady gait.  Venous duplex negative for DVT.  Chem-8 with initial hyperkalemia, however lab K+ WNL.  Suspect hemolysis on initial sample.  Results discussed with patient and family, they acknowledged understanding. Will discharge home with naproxen and Robaxin. Follow-up with PCP.  Discussed plan with patient, he/she acknowledged understanding and agreed with plan of care.  Return precautions given for new or worsening symptoms.  Garlon Hatchet, PA-C 05/23/16 1657  Bethann Berkshire, MD 05/23/16 2144

## 2016-05-23 NOTE — Progress Notes (Signed)
VASCULAR LAB PRELIMINARY  PRELIMINARY  PRELIMINARY  PRELIMINARY  Left lower extremity venous duplex completed.    Preliminary report:  Left:  No evidence of DVT, superficial thrombosis, or Baker's cyst.  Gave results to patient's nurse.  Jenetta Logesami Loranzo Desha, RVT, RDMS 05/23/2016, 4:19 PM

## 2016-05-23 NOTE — ED Notes (Signed)
Vascular ultra sound at bedside.

## 2016-05-23 NOTE — Discharge Instructions (Signed)
Take the prescribed medication as directed. Follow-up with your primary care doctor if you continue having issues with your leg. Return to the ED for new or worsening symptoms.

## 2016-05-23 NOTE — ED Notes (Addendum)
Pt reports intermittent cramping and tightness of right lower leg; denies swelling. Pt request evaluation related to family hx of DVT and PE.

## 2016-05-23 NOTE — ED Notes (Signed)
PA at bedside.

## 2016-05-27 LAB — I-STAT CHEM 8, ED
BUN: 14 mg/dL (ref 6–20)
CALCIUM ION: 1.09 mmol/L — AB (ref 1.12–1.23)
CHLORIDE: 101 mmol/L (ref 101–111)
Creatinine, Ser: 0.9 mg/dL (ref 0.61–1.24)
Glucose, Bld: 107 mg/dL — ABNORMAL HIGH (ref 65–99)
HEMATOCRIT: 43 % (ref 39.0–52.0)
Hemoglobin: 14.6 g/dL (ref 13.0–17.0)
Potassium: 7.5 mmol/L (ref 3.5–5.1)
SODIUM: 138 mmol/L (ref 135–145)
TCO2: 35 mmol/L (ref 0–100)

## 2016-09-10 ENCOUNTER — Emergency Department
Admission: EM | Admit: 2016-09-10 | Discharge: 2016-09-10 | Disposition: A | Discharge status: Home / Self Care | Payer: BLUE CROSS/BLUE SHIELD | Attending: Emergency Medicine | Admitting: Emergency Medicine

## 2016-09-10 ENCOUNTER — Encounter: Payer: Self-pay | Admitting: Emergency Medicine

## 2016-09-10 DIAGNOSIS — F17210 Nicotine dependence, cigarettes, uncomplicated: Secondary | ICD-10-CM | POA: Insufficient documentation

## 2016-09-10 DIAGNOSIS — M79604 Pain in right leg: Secondary | ICD-10-CM | POA: Diagnosis not present

## 2016-09-10 DIAGNOSIS — F902 Attention-deficit hyperactivity disorder, combined type: Secondary | ICD-10-CM | POA: Insufficient documentation

## 2016-09-10 DIAGNOSIS — M79661 Pain in right lower leg: Secondary | ICD-10-CM | POA: Diagnosis present

## 2016-09-10 DIAGNOSIS — Z79899 Other long term (current) drug therapy: Secondary | ICD-10-CM | POA: Insufficient documentation

## 2016-09-10 NOTE — ED Triage Notes (Addendum)
C/o pain to right calf and knee starting 4 days ago. Reports did have swelling yesterday but went away. Pain worse when "relaxed" but pain with movement as well. Did have same a few months ago and went to ED and had US to r/o DVT which was negative. No swelling or redness at this time.
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
## 2016-09-26 ENCOUNTER — Ambulatory Visit (INDEPENDENT_AMBULATORY_CARE_PROVIDER_SITE_OTHER): Payer: BLUE CROSS/BLUE SHIELD | Admitting: Family Medicine

## 2016-09-26 ENCOUNTER — Encounter: Payer: Self-pay | Admitting: Family Medicine

## 2016-09-26 VITALS — BP 118/72 | HR 79 | Temp 98.1°F | Resp 14 | Wt 161.0 lb

## 2016-09-26 DIAGNOSIS — F411 Generalized anxiety disorder: Secondary | ICD-10-CM | POA: Diagnosis not present

## 2016-09-26 DIAGNOSIS — G528 Disorders of other specified cranial nerves: Secondary | ICD-10-CM

## 2016-09-26 DIAGNOSIS — R5383 Other fatigue: Secondary | ICD-10-CM | POA: Insufficient documentation

## 2016-09-26 DIAGNOSIS — Z0000 Encounter for general adult medical examination without abnormal findings: Secondary | ICD-10-CM | POA: Diagnosis not present

## 2016-09-26 DIAGNOSIS — F32A Depression, unspecified: Secondary | ICD-10-CM

## 2016-09-26 DIAGNOSIS — N529 Male erectile dysfunction, unspecified: Secondary | ICD-10-CM | POA: Insufficient documentation

## 2016-09-26 DIAGNOSIS — F329 Major depressive disorder, single episode, unspecified: Secondary | ICD-10-CM | POA: Diagnosis not present

## 2016-09-26 DIAGNOSIS — H9312 Tinnitus, left ear: Secondary | ICD-10-CM | POA: Diagnosis not present

## 2016-09-26 DIAGNOSIS — F17210 Nicotine dependence, cigarettes, uncomplicated: Secondary | ICD-10-CM

## 2016-09-26 LAB — CBC WITH DIFFERENTIAL/PLATELET
BASOS ABS: 0 {cells}/uL (ref 0–200)
Basophils Relative: 0 %
EOS ABS: 103 {cells}/uL (ref 15–500)
Eosinophils Relative: 1 %
HCT: 43.4 % (ref 38.5–50.0)
HEMOGLOBIN: 14.4 g/dL (ref 13.2–17.1)
LYMPHS ABS: 1957 {cells}/uL (ref 850–3900)
Lymphocytes Relative: 19 %
MCH: 29.5 pg (ref 27.0–33.0)
MCHC: 33.2 g/dL (ref 32.0–36.0)
MCV: 88.9 fL (ref 80.0–100.0)
MONOS PCT: 8 %
MPV: 10.7 fL (ref 7.5–12.5)
Monocytes Absolute: 824 cells/uL (ref 200–950)
NEUTROS ABS: 7416 {cells}/uL (ref 1500–7800)
NEUTROS PCT: 72 %
Platelets: 216 10*3/uL (ref 140–400)
RBC: 4.88 MIL/uL (ref 4.20–5.80)
RDW: 13.9 % (ref 11.0–15.0)
WBC: 10.3 10*3/uL (ref 3.8–10.8)

## 2016-09-26 LAB — COMPLETE METABOLIC PANEL WITH GFR
ALBUMIN: 4.5 g/dL (ref 3.6–5.1)
ALK PHOS: 45 U/L (ref 40–115)
ALT: 11 U/L (ref 9–46)
AST: 14 U/L (ref 10–40)
BILIRUBIN TOTAL: 0.4 mg/dL (ref 0.2–1.2)
BUN: 9 mg/dL (ref 7–25)
CO2: 29 mmol/L (ref 20–31)
Calcium: 9.7 mg/dL (ref 8.6–10.3)
Chloride: 102 mmol/L (ref 98–110)
Creat: 0.87 mg/dL (ref 0.60–1.35)
Glucose, Bld: 88 mg/dL (ref 65–99)
Potassium: 4.2 mmol/L (ref 3.5–5.3)
Sodium: 139 mmol/L (ref 135–146)
TOTAL PROTEIN: 6.6 g/dL (ref 6.1–8.1)

## 2016-09-26 LAB — LIPID PANEL
Cholesterol: 109 mg/dL — ABNORMAL LOW (ref 125–200)
HDL: 46 mg/dL (ref >=40)
LDL CALC: 52 mg/dL (ref <130)
Total CHOL/HDL Ratio: 2.4 Ratio (ref <=5.0)
Triglycerides: 57 mg/dL (ref <150)
VLDL: 11 mg/dL (ref <30)

## 2016-09-26 NOTE — Assessment & Plan Note (Signed)
Patient is unsure about this diagnosis, does not recall being depressed in 2009; not suffering from depression now; just anxiety
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
## 2016-09-26 NOTE — Assessment & Plan Note (Signed)
Refer to neurologist 

## 2016-09-26 NOTE — Patient Instructions (Addendum)
Check labs today We'll have you see the neurologist Return for a complete physical in the next few weeks  12 Ways to Curb Anxiety  ?Anxiety is normal human sensation. It is what helped our ancestors survive the pitfalls of the wilderness. Anxiety is defined as experiencing worry or nervousness about an imminent event or something with an uncertain outcome. It is a feeling experienced by most people at some point in their lives. Anxiety can be triggered by a very personal issue, such as the illness of a loved one, or an event of global proportions, such as a refugee crisis. Some of the symptoms of anxiety are:  Feeling restless.   Having a feeling of impending danger.  Increased heart rate.  Rapid breathing. Sweating.  Shaking.  Weakness or feeling tired.  Difficulty concentrating on anything except the current worry.  Insomnia.  Stomach or bowel problems. What can we do about anxiety we may be feeling? There are many techniques to help manage stress and relax. Here are 12 ways you can reduce your anxiety almost immediately: 1. Turn off the constant feed of information. Take a social media sabbatical. Studies have shown that social media directly contributes to social anxiety.  2. Monitor your television viewing habits. Are you watching shows that are also contributing to your anxiety, such as 24-hour news stations? Try watching something else, or better yet, nothing at all. Instead, listen to music, read an inspirational book or practice a hobby. 3. Eat nutritious meals. Also, don't skip meals and keep healthful snacks on hand. Hunger and poor diet contributes to feeling anxious. 4. Sleep. Sleeping on a regular schedule for at least seven to eight hours a night will do wonders for your outlook when you are awake. 5. Exercise. Regular exercise will help rid your body of that anxious energy and help you get more restful sleep. 6. Try deep (diaphragmatic) breathing. Inhale slowly through your  nose for five seconds and exhale through your mouth. 7. Practice acceptance and gratitude. When anxiety hits, accept that there are things out of your control that shouldn't be of immediate concern.  8. Seek out humor. When anxiety strikes, watch a funny video, read jokes or call a friend who makes you laugh. Laughter is healing for our bodies and releases endorphins that are calming. 9. Stay positive. Take the effort to replace negative thoughts with positive ones. Try to see a stressful situation in a positive light. Try to come up with solutions rather than dwelling on the problem. 10. Figure out what triggers your anxiety. Keep a journal and make note of anxious moments and the events surrounding them. This will help you identify triggers you can avoid or even eliminate. 11. Talk to someone. Let a trusted friend, family member or even trained professional know that you are feeling overwhelmed and anxious. Verbalize what you are feeling and why.  12. Volunteer. If your anxiety is triggered by a crisis on a large scale, become an advocate and work to resolve the problem that is causing you unease. Anxiety is often unwelcome and can become overwhelming. If not kept in check, it can become a disorder that could require medical treatment. However, if you take the time to care for yourself and avoid the triggers that make you anxious, you will be able to find moments of relaxation and clarity that make your life much more enjoyable.

## 2016-09-26 NOTE — Assessment & Plan Note (Signed)
Check labs and return for CPE

## 2016-09-26 NOTE — Assessment & Plan Note (Signed)
Check labs, refer to neurologist

## 2016-09-26 NOTE — Assessment & Plan Note (Signed)
Encouraged cessation.

## 2016-09-26 NOTE — Assessment & Plan Note (Signed)
Check testosterone. 

## 2016-09-26 NOTE — Assessment & Plan Note (Addendum)
Not taking any medicines; I have no plans to prescribe a benzodiazepine

## 2016-09-27 LAB — SEDIMENTATION RATE: Sed Rate: 1 mm/hr (ref 0–15)

## 2016-09-27 LAB — TSH: TSH: 1.68 mIU/L (ref 0.40–4.50)

## 2016-09-27 LAB — TESTOSTERONE: TESTOSTERONE: 447 ng/dL (ref 250–827)

## 2016-09-27 NOTE — Assessment & Plan Note (Signed)
Check labs 

## 2016-09-29 ENCOUNTER — Telehealth: Payer: Self-pay | Admitting: Family Medicine

## 2016-09-29 NOTE — Telephone Encounter (Signed)
PT IS ASKING FOR RESULTS OF LABS

## 2016-09-30 ENCOUNTER — Emergency Department: Payer: BLUE CROSS/BLUE SHIELD

## 2016-09-30 ENCOUNTER — Emergency Department
Admission: EM | Admit: 2016-09-30 | Discharge: 2016-09-30 | Disposition: A | Discharge status: Home / Self Care | Payer: BLUE CROSS/BLUE SHIELD | Attending: Emergency Medicine | Admitting: Emergency Medicine

## 2016-09-30 DIAGNOSIS — F17210 Nicotine dependence, cigarettes, uncomplicated: Secondary | ICD-10-CM | POA: Diagnosis not present

## 2016-09-30 DIAGNOSIS — R51 Headache: Secondary | ICD-10-CM | POA: Diagnosis not present

## 2016-09-30 DIAGNOSIS — F909 Attention-deficit hyperactivity disorder, unspecified type: Secondary | ICD-10-CM | POA: Diagnosis not present

## 2016-09-30 DIAGNOSIS — R519 Headache, unspecified: Secondary | ICD-10-CM

## 2016-09-30 MED ORDER — BUTALBITAL-APAP-CAFFEINE 50-325-40 MG PO TABS: 1.0000 | ORAL_TABLET | Freq: Four times a day (QID) | ORAL | 0 refills | Status: DC | PRN

## 2016-09-30 MED ORDER — BUTALBITAL-APAP-CAFFEINE 50-325-40 MG PO TABS
ORAL_TABLET | ORAL | Status: AC
  Administered 2016-09-30: 1 via ORAL
  Filled 2016-09-30: qty 1

## 2016-09-30 MED ORDER — BUTALBITAL-APAP-CAFFEINE 50-325-40 MG PO TABS
1.0000 | ORAL_TABLET | Freq: Once | ORAL | Status: AC
  Administered 2016-09-30: 1 via ORAL

## 2016-09-30 NOTE — Discharge Instructions (Signed)
Please seek medical attention for any high fevers, chest pain, shortness of breath, change in behavior, persistent vomiting, bloody stool or any other new or concerning symptoms.  
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
## 2016-09-30 NOTE — ED Notes (Signed)
Patient transported to CT 

## 2016-09-30 NOTE — ED Triage Notes (Signed)
Pt c/o increased pain in the left temple over the past couple until the past couple of days having dizziness/disorientation..Marland Kitchen

## 2016-11-11 ENCOUNTER — Encounter: Payer: BLUE CROSS/BLUE SHIELD | Admitting: Family Medicine

## 2017-05-11 ENCOUNTER — Emergency Department: Payer: BLUE CROSS/BLUE SHIELD

## 2017-05-11 ENCOUNTER — Emergency Department
Admission: EM | Admit: 2017-05-11 | Discharge: 2017-05-11 | Disposition: A | Discharge status: Home / Self Care | Payer: BLUE CROSS/BLUE SHIELD | Attending: Emergency Medicine | Admitting: Emergency Medicine

## 2017-05-11 ENCOUNTER — Encounter: Payer: Self-pay | Admitting: Emergency Medicine

## 2017-05-11 DIAGNOSIS — R51 Headache: Secondary | ICD-10-CM | POA: Insufficient documentation

## 2017-05-11 DIAGNOSIS — F902 Attention-deficit hyperactivity disorder, combined type: Secondary | ICD-10-CM | POA: Insufficient documentation

## 2017-05-11 DIAGNOSIS — F17210 Nicotine dependence, cigarettes, uncomplicated: Secondary | ICD-10-CM | POA: Insufficient documentation

## 2017-05-11 DIAGNOSIS — R519 Headache, unspecified: Secondary | ICD-10-CM

## 2017-05-11 DIAGNOSIS — F1290 Cannabis use, unspecified, uncomplicated: Secondary | ICD-10-CM | POA: Insufficient documentation

## 2017-05-11 DIAGNOSIS — R202 Paresthesia of skin: Secondary | ICD-10-CM | POA: Insufficient documentation

## 2017-05-11 MED ORDER — BUTALBITAL-APAP-CAFFEINE 50-325-40 MG PO TABS: 1.0000 | ORAL_TABLET | Freq: Four times a day (QID) | ORAL | 0 refills | Status: AC | PRN

## 2017-05-11 MED ORDER — SODIUM CHLORIDE 0.9 % IV BOLUS (SEPSIS)
1000.0000 mL | Freq: Once | INTRAVENOUS | Status: AC
  Administered 2017-05-11: 1000 mL via INTRAVENOUS

## 2017-05-11 MED ORDER — METOCLOPRAMIDE HCL 5 MG/ML IJ SOLN
10.0000 mg | Freq: Once | INTRAMUSCULAR | Status: AC
  Administered 2017-05-11: 10 mg via INTRAVENOUS
  Filled 2017-05-11: qty 2

## 2017-05-11 MED ORDER — DIPHENHYDRAMINE HCL 50 MG/ML IJ SOLN
25.0000 mg | Freq: Once | INTRAMUSCULAR | Status: AC
  Administered 2017-05-11: 25 mg via INTRAVENOUS
  Filled 2017-05-11: qty 1
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
## 2017-05-11 NOTE — ED Notes (Signed)
Spoke with Dr Don PerkingVeronese regarding pt's presenting symptoms/complaint; no orders given at this time, would like pt to be examined prior to any tests performed

## 2017-05-11 NOTE — ED Triage Notes (Addendum)
Pt reports headache to the left side of his head intermittently for 8 months; has been referred to a specialist by his primary but has never followed up; for the last 3 days the pain has returned and stayed; reports pressure behind his left eye and at left temple radiates down to left side of his neck behind his ear; pt also reports the muscles of left arm feeling stiff and intermittent cramping in left leg; occasionally has blurred vision to left eye or left eye dryness; pt ambulatory with steady gait; talking in complete coherent sentences; pt admits to drinking Saturday  night with friends, and smoking marijuana Sunday morning; these 2 things seem to increase his pain;

## 2017-05-11 NOTE — Discharge Instructions (Signed)
Please follow up with neurology.

## 2019-01-21 ENCOUNTER — Other Ambulatory Visit: Payer: Self-pay

## 2019-01-21 ENCOUNTER — Encounter: Payer: Self-pay | Admitting: Emergency Medicine

## 2019-01-21 ENCOUNTER — Emergency Department
Admission: EM | Admit: 2019-01-21 | Discharge: 2019-01-21 | Disposition: A | Discharge status: Home / Self Care | Payer: BLUE CROSS/BLUE SHIELD | Attending: Emergency Medicine | Admitting: Emergency Medicine

## 2019-01-21 DIAGNOSIS — J111 Influenza due to unidentified influenza virus with other respiratory manifestations: Secondary | ICD-10-CM

## 2019-01-21 DIAGNOSIS — F17210 Nicotine dependence, cigarettes, uncomplicated: Secondary | ICD-10-CM | POA: Insufficient documentation

## 2019-01-21 MED ORDER — ACETAMINOPHEN 500 MG PO TABS: 500.0000 mg | ORAL_TABLET | Freq: Four times a day (QID) | ORAL | 0 refills | Status: DC | PRN

## 2019-01-21 MED ORDER — IBUPROFEN 600 MG PO TABS: 600.0000 mg | ORAL_TABLET | Freq: Four times a day (QID) | ORAL | 0 refills | Status: DC | PRN

## 2019-01-21 MED ORDER — BENZONATATE 100 MG PO CAPS: 100.0000 mg | ORAL_CAPSULE | Freq: Three times a day (TID) | ORAL | 0 refills | Status: DC | PRN

## 2019-01-21 MED ORDER — OSELTAMIVIR PHOSPHATE 75 MG PO CAPS: 75.0000 mg | ORAL_CAPSULE | Freq: Two times a day (BID) | ORAL | 0 refills | Status: AC

## 2019-01-21 NOTE — ED Triage Notes (Signed)
Flu sx x1 day , family with recent exposure (kids).  Body aches,headache, nausea

## 2019-01-21 NOTE — ED Notes (Signed)
See triage note  States both of his children were dx'd with the flu this week    Developed sx's yesterday  Afebrile on arrival
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
## 2019-06-10 ENCOUNTER — Encounter: Payer: Self-pay | Admitting: Nurse Practitioner

## 2019-06-10 ENCOUNTER — Ambulatory Visit (INDEPENDENT_AMBULATORY_CARE_PROVIDER_SITE_OTHER): Payer: Self-pay | Admitting: Nurse Practitioner

## 2019-06-10 ENCOUNTER — Telehealth: Payer: Self-pay | Admitting: Nurse Practitioner

## 2019-06-10 ENCOUNTER — Ambulatory Visit: Payer: Self-pay | Admitting: *Deleted

## 2019-06-10 DIAGNOSIS — R6889 Other general symptoms and signs: Secondary | ICD-10-CM

## 2019-06-10 DIAGNOSIS — Z20828 Contact with and (suspected) exposure to other viral communicable diseases: Secondary | ICD-10-CM

## 2019-06-10 DIAGNOSIS — Z20822 Contact with and (suspected) exposure to covid-19: Secondary | ICD-10-CM

## 2019-06-10 DIAGNOSIS — R0602 Shortness of breath: Secondary | ICD-10-CM

## 2019-06-10 DIAGNOSIS — R059 Cough, unspecified: Secondary | ICD-10-CM

## 2019-06-10 DIAGNOSIS — R05 Cough: Secondary | ICD-10-CM

## 2019-06-10 DIAGNOSIS — F4541 Pain disorder exclusively related to psychological factors: Secondary | ICD-10-CM

## 2019-06-10 MED ORDER — BENZONATATE 100 MG PO CAPS: 100.0000 mg | ORAL_CAPSULE | Freq: Three times a day (TID) | ORAL | 0 refills | Status: DC | PRN

## 2019-06-10 NOTE — Telephone Encounter (Signed)
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

## 2019-06-10 NOTE — Progress Notes (Signed)
Virtual Visit via Video Note  I connected with Arlina RobesMichael Seymore on 06/10/19 at  2:20 PM EDT by a video enabled telemedicine application and verified that I am speaking with the correct person using two identifiers.   Staff discussed the limitations of evaluation and management by telemedicine and the availability of in person appointments. The patient expressed understanding and agreed to proceed.  Patient location: home  My location: work office Other people present: none HPI  Patient endorses shortness of breath and cough and burning feeling in from upper chest to epigastric area that started 3 days ago. Patient wears mask at work but works at [employer]- has had 3 positive COVID cases at work. Patient states when he wears the mask it makes him feel more short of breath. States last night he had night sweats- took ibuprofen for a headache- frontal left sided headache for the past 3-4 days. Shortness of breath is typically with exertion but becomes more apparent when he is wearing a mask. States he feels winded even talking with mask on.   Quit smoking over year and is vaping occasionally.    PHQ2/9: [facility] 06/10/2019 09/26/2016 12/04/2015 11/29/2015 11/21/2015  Decreased Interest 0 0 0 0 0  Down, Depressed, Hopeless 0 0 0 0 0  PHQ - 2 Score 0 0 0 0 0  Altered sleeping 0 - - - -  Tired, decreased energy 0 - - - -  Change in appetite 0 - - - -  Feeling bad or failure about yourself  0 - - - -  Trouble concentrating 0 - - - -  Moving slowly or fidgety/restless 0 - - - -  Suicidal thoughts 0 - - - -  PHQ-9 Score 0 - - - -  Difficult doing work/chores Not difficult at all - - - -     PHQ reviewed. Negative  Patient Active Problem List   Diagnosis Date Noted  . 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 Concussion without loss of consciousness 12/04/2015  . Intermittent  palpitations 07/04/2015  . GAD (generalized anxiety disorder) 07/04/2015  . Cigarette smoker 06/12/2015  . 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 Clinical depression 08/08/2008  . Obsessive-compulsive disorder 08/08/2008    Past Medical History:  Diagnosis Date  . Attention deficit hyperactivity disorder (ADHD), combined type, mild   . Generalized anxiety disorder     Past Surgical History:  Procedure Laterality Date  . TONSILLECTOMY    . TOOTH EXTRACTION      Social History   Tobacco Use  . Smoking status: Current Every Day Smoker    Packs/day: 1.00    Types: Cigarettes  . Smokeless tobacco: Never Used  Substance Use Topics  . Alcohol use: Yes    Alcohol/week: 0.0 standard drinks    Comment: occasionally      Current Outpatient Medications:  .  ibuprofen (ADVIL,MOTRIN) 600 MG tablet, Take 1 tablet (600 mg total) by mouth every 6 (six) hours as needed., Disp: 30 tablet, Rfl: 0 .  acetaminophen (TYLENOL) 500 MG tablet, Take 1 tablet (500 mg total) by mouth every 6 (six) hours as needed. (Patient not taking: Reported on 06/10/2019), Disp: 30 tablet, Rfl: 0 .  benzonatate (TESSALON PERLES) 100 MG capsule, Take 1 capsule (100 mg total) by mouth 3 (three) times daily as needed for cough., Disp: 30 capsule, Rfl: 0  Allergies  Allergen Reactions  .  Sulfa Antibiotics Hives    High fever.   . Tramadol Hcl     unknown    ROS   No other specific complaints in a complete review of systems (except as listed in HPI above).  Objective  There were no vitals filed for this visit.   There is no height or weight on file to calculate BMI.  Nursing Note and Vital Signs reviewed.  Physical Exam   Constitutional: Patient appears well-developed and well-nourished. No distress.  HENT: Head: Normocephalic and atraumatic. Pulmonary/Chest: Effort normal  Musculoskeletal: Normal range of motion,  Neurological: alert and oriented, speech normal.  Skin: No rash noted. No erythema.   Psychiatric: Patient has a normal mood and affect. behavior is normal. Judgment and thought content normal.    Assessment & Plan  1. Close Exposure to Covid-19 Virus Recommended testing, check temperature.   2. Shortness of breath Deep breathing if worsening discussed ER precautions  3. Cough Humidifier, mucinex- DM   4. Suspected Covid-19 Virus Infection Ordered due to symptoms and essential worker    Follow Up Instructions:    I discussed the assessment and treatment plan with the patient. The patient was provided an opportunity to ask questions and all were answered. The patient agreed with the plan and demonstrated an understanding of the instructions.   The patient was advised to call back or seek an in-person evaluation if the symptoms worsen or if the condition fails to improve as anticipated.  I provided 16 minutes of non-face-to-face time during this encounter.   Fredderick Severance, NP

## 2019-06-10 NOTE — Telephone Encounter (Signed)
Left voicemail for patient to return call to schedule COVID 19 test.  Test ordered. 

## 2019-06-10 NOTE — Patient Instructions (Addendum)
I suspect you may have COVID-19/coronavirus and recommend that you are tested to confirm.  Please call [facility]'s COVID-19 hotline at [phone] to determine if you are eligible for testing.  

## 2019-06-10 NOTE — Addendum Note (Signed)
Addended by: Denyce Robert on: 06/10/2019 04:19 PM   Modules accepted: Orders

## 2019-06-10 NOTE — Telephone Encounter (Signed)
Patient is calling to schedule COVID 19 testing. Test is ordered. Thank you

## 2019-06-10 NOTE — Telephone Encounter (Signed)
Pt called in c/o having trouble with taking deep breaths.   He was exposed to co-worker who tested positive for the COVID-19.  I warm transferred the call into [facility] to be scheduled.   Reason for Disposition . Chest pain or pressure  Answer Assessment - Initial Assessment Questions 1. CLOSE CONTACT: "Who is the person with the confirmed or suspected COVID-19 infection that you were exposed to?"     **A co-worker.  Who is positive for the COVID-19.2. PLACE of CONTACT: "Where were you when you were exposed to COVID-19?" (e.g., home, school, medical waiting room; which city?)     Within the past 2 weeks. 3. TYPE of CONTACT: "How much contact was there?" (e.g., sitting next to, live in same house, work in same office, same building)     Working together 4. 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 5. DATE of CONTACT: "When did you have contact with a COVID-19 patient?" (e.g., how many days ago)     Last 2 weeks. 6. TRAVEL: "Have you traveled out of the country recently?" If so, "When and where?"     * Also ask about out-of-state travel, since the CDC has identified some high-risk cities for community spread in the [place].     * Note: Travel becomes less relevant if there is widespread community transmission where the patient lives.     No travels 7. COMMUNITY SPREAD: "Are there lots of cases of COVID-19 (community spread) where you live?" (See public health department website, if unsure)       Yes 8. SYMPTOMS: "Do you have any symptoms?" (e.g., fever, cough, breathing difficulty)     I'm coughing and a burning feeling in my chest.   It's hard to get a big deep breath.   I feel like I may pass out or I'm disoriented.   The mask makes it hard to breath. 9. PREGNANCY OR POSTPARTUM: "Is there any chance you are pregnant?" "When  was your last menstrual period?" "Did you deliver in the last 2 weeks?"     N/A 10. HIGH RISK: "Do you have any heart or lung problems? Do you have a weak immune system?" (e.g., CHF, COPD, asthma, HIV positive, chemotherapy, renal failure, diabetes mellitus, sickle cell anemia)       No  Answer Assessment - Initial Assessment Questions 1. COVID-19 DIAGNOSIS: "Who made your Coronavirus (COVID-19) diagnosis?" "Was it confirmed by a positive lab test?" If not diagnosed by a HCP, ask "Are there lots of cases (community spread) where you live?" (See public health department website, if unsure)     Need to be tested. Co-worker that is positive that I was in contact with. 2. ONSET: "When did the COVID-19 symptoms start?"      *No Answer* 3. WORST SYMPTOM: "What is your worst symptom?" (e.g., cough, fever, shortness of breath, muscle aches)     Cough and difficulty with a deep breath 4. COUGH: "Do you have a cough?" If so, ask: "How bad is the cough?"       Yes dry one 5. FEVER: "Do you have a fever?" If so, ask: "What is your temperature, how was it measured, and when did it start?"     No but taking Ibuprofen 6. RESPIRATORY STATUS: "Describe your breathing?" (e.g., shortness of  breath, wheezing, unable to speak)      *No Answer* 7. BETTER-SAME-WORSE: "Are you getting better, staying the same or getting worse compared to yesterday?"  If getting worse, ask, "In what way?"     *No Answer* 8. HIGH RISK DISEASE: "Do you have any chronic medical problems?" (e.g., asthma, heart or lung disease, weak immune system, etc.)     *No Answer* 9. PREGNANCY: "Is there any chance you are pregnant?" "When was your last menstrual period?"     *No Answer* 10. OTHER SYMPTOMS: "Do you have any other symptoms?"  (e.g., chills, fatigue, headache, loss of smell or taste, muscle pain, sore throat)       *No Answer*  Protocols used: CORONAVIRUS (COVID-19) DIAGNOSED OR SUSPECTED-A-AH, CORONAVIRUS (COVID-19)  EXPOSURE-A-AH

## 2019-06-12 ENCOUNTER — Telehealth: Payer: Self-pay

## 2019-06-12 NOTE — Telephone Encounter (Signed)
Called pt and testing is 06/13/19 at 12:45 at [facility] building

## 2019-06-12 NOTE — Telephone Encounter (Signed)
Called pt and scheduled Covid 19 testing. Appt is 06/13/19 at 12:45 at the [facility]. Pt advised of address, to wear a mask at the testing site and to stay in car for testing. Pt verbalized understanding.

## 2019-06-13 ENCOUNTER — Other Ambulatory Visit: Payer: Self-pay

## 2019-06-13 DIAGNOSIS — Z20822 Contact with and (suspected) exposure to covid-19: Secondary | ICD-10-CM

## 2019-06-17 LAB — NOVEL CORONAVIRUS, NAA: SARS-CoV-2, NAA: NOT DETECTED

## 2019-08-08 ENCOUNTER — Encounter: Payer: Self-pay | Admitting: Emergency Medicine

## 2019-08-08 ENCOUNTER — Other Ambulatory Visit: Payer: Self-pay

## 2019-08-08 ENCOUNTER — Observation Stay
Admission: EM | Admit: 2019-08-08 | Discharge: 2019-08-09 | Disposition: A | Discharge status: Home / Self Care | Payer: Self-pay | Attending: General Surgery | Admitting: General Surgery

## 2019-08-08 ENCOUNTER — Emergency Department: Payer: Self-pay

## 2019-08-08 DIAGNOSIS — Z20828 Contact with and (suspected) exposure to other viral communicable diseases: Secondary | ICD-10-CM | POA: Insufficient documentation

## 2019-08-08 DIAGNOSIS — F411 Generalized anxiety disorder: Secondary | ICD-10-CM | POA: Insufficient documentation

## 2019-08-08 DIAGNOSIS — K219 Gastro-esophageal reflux disease without esophagitis: Secondary | ICD-10-CM | POA: Insufficient documentation

## 2019-08-08 DIAGNOSIS — Z87891 Personal history of nicotine dependence: Secondary | ICD-10-CM | POA: Insufficient documentation

## 2019-08-08 DIAGNOSIS — F909 Attention-deficit hyperactivity disorder, unspecified type: Secondary | ICD-10-CM | POA: Insufficient documentation

## 2019-08-08 DIAGNOSIS — K353 Acute appendicitis with localized peritonitis, without perforation or gangrene: Principal | ICD-10-CM | POA: Insufficient documentation

## 2019-08-08 DIAGNOSIS — K3580 Unspecified acute appendicitis: Secondary | ICD-10-CM

## 2019-08-08 LAB — CBC WITH DIFFERENTIAL/PLATELET
Abs Immature Granulocytes: 0.07 10*3/uL (ref 0.00–0.07)
Basophils Absolute: 0 10*3/uL (ref 0.0–0.1)
Basophils Relative: 0 %
Eosinophils Absolute: 0 10*3/uL (ref 0.0–0.5)
Eosinophils Relative: 0 %
HCT: 43.6 % (ref 39.0–52.0)
Hemoglobin: 15 g/dL (ref 13.0–17.0)
Immature Granulocytes: 0 %
Lymphocytes Relative: 11 %
Lymphs Abs: 2.1 10*3/uL (ref 0.7–4.0)
MCH: 29.8 pg (ref 26.0–34.0)
MCHC: 34.4 g/dL (ref 30.0–36.0)
MCV: 86.7 fL (ref 80.0–100.0)
Monocytes Absolute: 1.3 10*3/uL — ABNORMAL HIGH (ref 0.1–1.0)
Monocytes Relative: 7 %
Neutro Abs: 14.8 10*3/uL — ABNORMAL HIGH (ref 1.7–7.7)
Neutrophils Relative %: 82 %
Platelets: 280 10*3/uL (ref 150–400)
RBC: 5.03 MIL/uL (ref 4.22–5.81)
RDW: 12.2 % (ref 11.5–15.5)
WBC: 18.3 10*3/uL — ABNORMAL HIGH (ref 4.0–10.5)
nRBC: 0 % (ref 0.0–0.2)

## 2019-08-08 LAB — COMPREHENSIVE METABOLIC PANEL
ALT: 41 U/L (ref 0–44)
AST: 28 U/L (ref 15–41)
Albumin: 4.7 g/dL (ref 3.5–5.0)
Alkaline Phosphatase: 63 U/L (ref 38–126)
Anion gap: 13 (ref 5–15)
BUN: 11 mg/dL (ref 6–20)
CO2: 24 mmol/L (ref 22–32)
Calcium: 9.6 mg/dL (ref 8.9–10.3)
Chloride: 101 mmol/L (ref 98–111)
Creatinine, Ser: 0.89 mg/dL (ref 0.61–1.24)
GFR calc Af Amer: >60 mL/min (ref >60)
GFR calc non Af Amer: >60 mL/min (ref >60)
Glucose, Bld: 109 mg/dL — ABNORMAL HIGH (ref 70–99)
Potassium: 3.5 mmol/L (ref 3.5–5.1)
Sodium: 138 mmol/L (ref 135–145)
Total Bilirubin: 0.9 mg/dL (ref 0.3–1.2)
Total Protein: 7.8 g/dL (ref 6.5–8.1)

## 2019-08-08 LAB — LIPASE, BLOOD: Lipase: 19 U/L (ref 11–51)

## 2019-08-08 MED ORDER — ONDANSETRON HCL 4 MG/2ML IJ SOLN
4.0000 mg | Freq: Once | INTRAMUSCULAR | Status: AC
  Administered 2019-08-08: 4 mg via INTRAVENOUS
  Filled 2019-08-08: qty 2

## 2019-08-08 MED ORDER — SODIUM CHLORIDE 0.9 % IV BOLUS
1000.0000 mL | Freq: Once | INTRAVENOUS | Status: AC
  Administered 2019-08-08: 1000 mL via INTRAVENOUS

## 2019-08-08 MED ORDER — SODIUM CHLORIDE 0.9 % IV SOLN: Freq: Once | INTRAVENOUS | Status: DC

## 2019-08-08 MED ORDER — MORPHINE SULFATE (PF) 4 MG/ML IV SOLN
6.0000 mg | Freq: Once | INTRAVENOUS | Status: AC
  Administered 2019-08-08: 6 mg via INTRAVENOUS
  Filled 2019-08-08: qty 2

## 2019-08-08 MED ORDER — METRONIDAZOLE IN NACL 5-0.79 MG/ML-% IV SOLN
500.0000 mg | Freq: Three times a day (TID) | INTRAVENOUS | Status: DC
  Administered 2019-08-09: 500 mg via INTRAVENOUS
  Filled 2019-08-08 (×4): qty 100

## 2019-08-08 MED ORDER — HYDROMORPHONE HCL 1 MG/ML IJ SOLN
1.0000 mg | Freq: Once | INTRAMUSCULAR | Status: AC
  Administered 2019-08-08: 1 mg via INTRAVENOUS
  Filled 2019-08-08: qty 1

## 2019-08-08 MED ORDER — HYDROCODONE-ACETAMINOPHEN 5-325 MG PO TABS
1.0000 | ORAL_TABLET | ORAL | Status: DC | PRN
  Administered 2019-08-09: 12:00:00 2 via ORAL
  Filled 2019-08-08: qty 2

## 2019-08-08 MED ORDER — ONDANSETRON 4 MG PO TBDP
4.0000 mg | ORAL_TABLET | Freq: Four times a day (QID) | ORAL | Status: DC | PRN
  Filled 2019-08-08: qty 1

## 2019-08-08 MED ORDER — SODIUM CHLORIDE 0.9 % IV SOLN
2.0000 g | Freq: Once | INTRAVENOUS | Status: AC
  Administered 2019-08-08: 2 g via INTRAVENOUS
  Filled 2019-08-08: qty 20

## 2019-08-08 MED ORDER — METRONIDAZOLE IN NACL 5-0.79 MG/ML-% IV SOLN
500.0000 mg | Freq: Once | INTRAVENOUS | Status: AC
  Administered 2019-08-08: 500 mg via INTRAVENOUS
  Filled 2019-08-08: qty 100

## 2019-08-08 MED ORDER — CEFTRIAXONE SODIUM 2 G IJ SOLR
2.0000 g | INTRAMUSCULAR | Status: DC
  Filled 2019-08-08: qty 20

## 2019-08-08 MED ORDER — ENOXAPARIN SODIUM 40 MG/0.4ML ~~LOC~~ SOLN: 40.0000 mg | SUBCUTANEOUS | Status: DC

## 2019-08-08 MED ORDER — ONDANSETRON HCL 4 MG/2ML IJ SOLN: 4.0000 mg | Freq: Four times a day (QID) | INTRAMUSCULAR | Status: DC | PRN

## 2019-08-08 MED ORDER — IOHEXOL 300 MG/ML  SOLN
100.0000 mL | Freq: Once | INTRAMUSCULAR | Status: AC | PRN
  Administered 2019-08-08: 100 mL via INTRAVENOUS

## 2019-08-08 MED ORDER — ACETAMINOPHEN 325 MG PO TABS: 650.0000 mg | ORAL_TABLET | Freq: Four times a day (QID) | ORAL | Status: DC | PRN

## 2019-08-08 MED ORDER — ACETAMINOPHEN 650 MG RE SUPP: 650.0000 mg | Freq: Four times a day (QID) | RECTAL | Status: DC | PRN

## 2019-08-08 MED ORDER — MORPHINE SULFATE (PF) 4 MG/ML IV SOLN
4.0000 mg | INTRAVENOUS | Status: DC | PRN
  Administered 2019-08-09: 4 mg via INTRAVENOUS
  Filled 2019-08-08: qty 1

## 2019-08-08 NOTE — ED Notes (Signed)
Surgeon at bedside.  

## 2019-08-08 NOTE — ED Notes (Signed)
Patient transported to CT 

## 2019-08-08 NOTE — ED Provider Notes (Signed)
[facility] Emergency Department Provider Note  ____________________________________________   First MD Initiated Contact with Patient 08/08/19 2152     (approximate)  I have reviewed the triage vital signs and the nursing notes.   HISTORY  Chief Complaint Abdominal Pain    HPI Lee Sandoval is a 28 y.o. male with history of ADHD here with Deis abdominal pain.  The patient states he woke up this morning with moderate but progressively more severe, generalized, but primarily peri-umbilical abdominal pain.  He said associated nausea and vomiting.  He said increased to frequency but no overt diarrhea.  He said subjective chills.  Slight loss of appetite as well.  Denies any known sick contacts.  No suspicious food intake.  Has been vomiting this afternoon and unable to eat or drink.  No history of similar symptoms.  Pain is aching, gnawing, worse with movement and palpation.       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 Authorizing Provider  acetaminophen (TYLENOL) 500 MG tablet Take 1 tablet (500 mg total) by mouth every 6 (six) hours as needed. Patient not taking: Reported on 06/10/2019 01/21/19   Laban Emperor, PA-C  benzonatate (TESSALON PERLES) 100 MG capsule Take 1 capsule (100 mg total) by mouth 3 (three) times daily as needed for cough. 06/10/19 06/09/20  Poulose, Bethel Born, NP  ibuprofen (ADVIL,MOTRIN) 600 MG tablet Take 1 tablet (600 mg total) by mouth every 6 (six) hours as needed. 01/21/19   Laban Emperor, PA-C    Allergies Sulfa antibiotics and Tramadol hcl  Family History  Problem Relation Age of Onset   Heart failure Father        died at age 46   Depression Mother    Mental illness Mother     Social History Social History   Tobacco Use   Smoking status: Former Smoker    Packs/day: 1.00    Types: Cigarettes   Smokeless tobacco: Current User  Substance Use Topics   Alcohol use: Yes    Alcohol/week: 0.0 standard drinks    Comment: occasionally    Drug use: Yes    Types: Marijuana    Comment: last smoked sunday morning    Review of Systems  Review of Systems  Constitutional: Positive for fatigue. Negative for chills and fever.  HENT: Negative for sore throat.   Respiratory: Negative for shortness of breath.   Cardiovascular: Negative for chest pain.  Gastrointestinal: Positive for abdominal pain, nausea and vomiting.  Genitourinary: Negative for flank pain.  Musculoskeletal: Negative for neck pain.  Skin: Negative for rash and wound.  Allergic/Immunologic: Negative for immunocompromised state.  Neurological: Negative for weakness and numbness.  Hematological: Does not bruise/bleed easily.  All other systems reviewed and are  negative.    ____________________________________________  PHYSICAL EXAM:      VITAL SIGNS: ED Triage Vitals  Enc Vitals Group     BP 08/08/19 1936 (!) 141/79     Pulse Rate 08/08/19 1936 81     Resp 08/08/19 1936 18     Temp 08/08/19 1936 97.8 F (36.6 C)     Temp Source 08/08/19 1936 Oral     SpO2 08/08/19 1936 100 %     Weight 08/08/19 1937 200 lb (90.7 kg)     Height 08/08/19 1937 6\' 2"  (1.88 m)     Head  Circumference --      Peak Flow --      Pain Score 08/08/19 1937 10     Pain Loc --      Pain Edu? --      Excl. in GC? --      Physical Exam Vitals signs and nursing note reviewed.  Constitutional:      General: He is not in acute distress.    Appearance: He is well-developed.  HENT:     Head: Normocephalic and atraumatic.  Eyes:     Conjunctiva/sclera: Conjunctivae normal.  Neck:     Musculoskeletal: Neck supple.  Cardiovascular:     Rate and Rhythm: Normal rate and regular rhythm.     Heart sounds: Normal heart sounds. No murmur. No friction rub.  Pulmonary:     Effort: Pulmonary effort is normal. No respiratory distress.     Breath sounds: Normal breath sounds. No wheezing or rales.  Abdominal:     General: There is no distension.     Palpations: Abdomen is soft.     Tenderness: There is abdominal tenderness in the right lower quadrant and periumbilical area. There is guarding and rebound. Positive signs include McBurney's sign.  Skin:    General: Skin is warm.     Capillary Refill: Capillary refill takes less than 2 seconds.  Neurological:     Mental Status: He is alert and oriented to person, place, and time.     Motor: No abnormal muscle tone.       ____________________________________________   LABS (all labs ordered are listed, but only abnormal results are displayed)  Labs Reviewed  CBC WITH DIFFERENTIAL/PLATELET - Abnormal; Notable for the following components:      Result Value   WBC 18.3 (*)    Neutro Abs 14.8 (*)    Monocytes Absolute 1.3 (*)    All other components within normal limits  COMPREHENSIVE METABOLIC PANEL - Abnormal; Notable for the following components:   Glucose, Bld 109 (*)    All other components within normal limits  SARS CORONAVIRUS 2 (HOSPITAL ORDER, PERFORMED IN [facility])  LIPASE, BLOOD  URINALYSIS, COMPLETE (UACMP) WITH MICROSCOPIC    ____________________________________________  EKG:  None ________________________________________  RADIOLOGY All imaging, including plain films, CT scans, and ultrasounds, independently reviewed by me, and interpretations confirmed via formal radiology reads.  ED MD interpretation:   CT: Acute appendicitis without abscess or complication  Official radiology report(s): Ct Abdomen Pelvis W Contrast  Result Date: 08/08/2019 CLINICAL DATA:  Nausea vomiting and lower abdominal pain EXAM: CT ABDOMEN AND PELVIS WITH CONTRAST TECHNIQUE: Multidetector CT imaging of the abdomen and pelvis was performed using the standard protocol following bolus administration of intravenous contrast. CONTRAST:  100mL OMNIPAQUE IOHEXOL 300 MG/ML  SOLN COMPARISON:  11/12/2013 FINDINGS: Lower chest: No acute abnormality. Hepatobiliary: No focal liver abnormality is seen. No gallstones, gallbladder  wall thickening, or biliary dilatation. Pancreas: Unremarkable. No pancreatic ductal dilatation or surrounding inflammatory changes. Spleen: Normal in size without focal abnormality. Adrenals/Urinary Tract: Adrenal glands are unremarkable. Kidneys are normal, without renal calculi, focal lesion, or hydronephrosis. Bladder is unremarkable. Stomach/Bowel: Stomach is nonenlarged. No dilated small bowel. Enlarged appendix, measuring up to 12 mm in size. Edema within the periappendiceal fat. No extraluminal gas. Vascular/Lymphatic: No significant vascular findings are present. No enlarged abdominal or pelvic lymph nodes. Reproductive: Prostate is unremarkable. Other: Negative for free air or free fluid Musculoskeletal: No acute or significant osseous findings. IMPRESSION: Findings consistent with acute appendicitis. Appendix: Location: Right lower quadrant Diameter: 12 mm Appendicolith: Negative Mucosal hyper-enhancement: Slight mucosal enhancement Extraluminal gas: Negative Periappendiceal collection: Negative Electronically Signed   By: Jasmine PangKim  Fujinaga M.D.   On: 08/08/2019 22:51     ____________________________________________  PROCEDURES   Procedure(s) performed (including Critical Care):  Procedures  ____________________________________________  INITIAL IMPRESSION / MDM / ASSESSMENT AND PLAN / ED COURSE  As part of my medical decision making, I reviewed the following data within the electronic MEDICAL RECORD NUMBER Notes from prior ED visits and [place] Controlled Substance Database      *Lee Sandoval was evaluated in Emergency Department on 08/08/2019 for the symptoms described in the history of present illness. He was evaluated in the context of the global COVID-19 pandemic, which necessitated consideration that the patient might be at risk for infection with the SARS-CoV-2 virus that causes COVID-19. Institutional protocols and algorithms that pertain to the evaluation of patients at risk for COVID-19 are in a state of rapid change based on information released by regulatory bodies including the CDC and federal and state organizations. These policies and algorithms were followed during the patient's care in the ED.  Some ED evaluations and interventions may be delayed as a result of limited staffing during the pandemic.*      Medical Decision Making: 28 year old male here with acute appendicitis. Labs show WBC 18k, otherwise no signs of sepsis. Dr. Maia Planintron consulted, will see in ED.   ____________________________________________  FINAL CLINICAL IMPRESSION(S) / ED DIAGNOSES  Final diagnoses:  Acute appendicitis, unspecified acute appendicitis type     MEDICATIONS GIVEN DURING THIS VISIT:  Medications  cefTRIAXone (ROCEPHIN) 2 g in sodium chloride 0.9 % 100 mL IVPB (2 g Intravenous New Bag/Given 08/08/19 2314)    And  metroNIDAZOLE (FLAGYL) IVPB 500 mg (500 mg Intravenous New Bag/Given 08/08/19 2312)  0.9 %  sodium chloride infusion (has no administration in time range)  morphine 4 MG/ML injection 6 mg (6 mg Intravenous Given 08/08/19 2212)   ondansetron (ZOFRAN) injection 4 mg (4 mg Intravenous Given 08/08/19 2212)  iohexol (OMNIPAQUE) 300 MG/ML solution 100 mL (100 mLs Intravenous Contrast Given 08/08/19 2227)  sodium chloride 0.9 % bolus 1,000 mL (1,000 mLs Intravenous New Bag/Given 08/08/19 2319)  HYDROmorphone (DILAUDID) injection 1 mg (1 mg Intravenous Given 08/08/19 2318)     ED Discharge Orders    None       Note:  This document was prepared using Dragon voice recognition software and may include unintentional dictation errors.   Shaune PollackIsaacs, Sanda Dejoy, MD 08/08/19 [phone]

## 2019-08-08 NOTE — ED Triage Notes (Signed)
Pt to triage via w/c, appears uncomfortable; c/o lower abd pain since 8am; st V x 5 with 4 BM today; denies hx of same

## 2019-08-08 NOTE — H&P (Signed)
SURGICAL CONSULTATION NOTE   HISTORY OF PRESENT ILLNESS (HPI):  28 y.o. male presented to [facility] ED for evaluation of abdominal pain. Patient reports feeling abdominal pain since yesterday morning.  He reported the pain started on the periumbilical area and then radiated to the right lower quadrant.  Pain is aggravated by abdominal wall movement.  Pain was alleviated by pain medication at the ED.  There was associated nausea but no vomiting.  Denies fever or chills.  Denies diarrhea.  Denies rectal bleeding.  Patient came to ED and he was found with leukocytosis.  CT scan of the abdomen and pelvis shows acute inflammation of the appendix without any free fluid or free air.  There is no abscess.  I personally evaluated the images.  Surgery is consulted by Dr. Bland Span in this context for evaluation and management of acute appendicitis.  PAST MEDICAL HISTORY (PMH):  Past Medical History:  Diagnosis Date  . Attention deficit hyperactivity disorder (ADHD), combined type, mild   . Generalized anxiety disorder      PAST SURGICAL HISTORY ([place]):  Past Surgical History:  Procedure Laterality Date  . TONSILLECTOMY    . TOOTH EXTRACTION       MEDICATIONS:  Prior to Admission medications   Medication Sig Start Date End Date Taking? Authorizing Provider  acetaminophen (TYLENOL) 500 MG tablet Take 1 tablet (500 mg total) by mouth every 6 (six) hours as needed. Patient not taking: Reported on 06/10/2019 01/21/19   Laban Emperor, PA-C  benzonatate (TESSALON PERLES) 100 MG capsule Take 1 capsule (100 mg total) by mouth 3 (three) times daily as needed for cough. 06/10/19 06/09/20  Poulose, Bethel Born, NP  ibuprofen (ADVIL,MOTRIN) 600 MG tablet Take 1 tablet (600 mg total) by mouth every 6 (six) hours as needed. 01/21/19   Laban Emperor, PA-C     ALLERGIES:  Allergies  Allergen Reactions  . Sulfa Antibiotics Hives    High fever.   . Tramadol Hcl     unknown     SOCIAL HISTORY:  Social History    Socioeconomic History  . Marital status: Married    Spouse name: Not on file  . Number of children: 2  . Years of education: Not on file  . Highest education level: Not on file  Occupational History  . Occupation: Gaffer  Social Needs  . Financial resource strain: Not on file  . Food insecurity    Worry: Not on file    Inability: Not on file  . Transportation needs    Medical: Not on file    Non-medical: Not on file  Tobacco Use  . Smoking status: Former Smoker    Packs/day: 1.00    Types: Cigarettes  . Smokeless tobacco: Current User  Substance and Sexual Activity  . Alcohol use: Yes    Alcohol/week: 0.0 standard drinks    Comment: occasionally   . Drug use: Yes    Types: Marijuana    Comment: last smoked sunday morning  . Sexual activity: Yes    Partners: Female  Lifestyle  . Physical activity    Days per week: Not on file    Minutes per session: Not on file  . Stress: Not on file  Relationships  . Social Herbalist on phone: Not on file    Gets together: Not on file    Attends religious service: Not on file    Active member of club or organization: Not on file    Attends  meetings of clubs or organizations: Not on file    Relationship status: Not on file  . Intimate partner violence    Fear of current or ex partner: Not on file    Emotionally abused: Not on file    Physically abused: Not on file    Forced sexual activity: Not on file  Other Topics Concern  . Not on file  Social History Narrative  . Not on file    The patient currently resides (home / rehab facility / nursing home): Home The patient normally is (ambulatory / bedbound): Ambulatory   FAMILY HISTORY:  Family History  Problem Relation Age of Onset  . Heart failure Father        died at age 569  . Depression Mother   . Mental illness Mother      REVIEW OF SYSTEMS:  Constitutional: denies weight loss, fever, chills, or sweats  Eyes: denies any other vision changes,  history of eye injury  ENT: denies sore throat, hearing problems  Respiratory: denies shortness of breath, wheezing  Cardiovascular: denies chest pain, palpitations  Gastrointestinal: positive abdominal pain, positive for nausea, no vomiting Genitourinary: denies burning with urination or urinary frequency Musculoskeletal: denies any other joint pains or cramps  Skin: denies any other rashes or skin discolorations  Neurological: denies any other headache, dizziness, weakness  Psychiatric: denies any other depression, anxiety   All other review of systems were negative   VITAL SIGNS:  Temp:  [97.8 F (36.6 C)] 97.8 F (36.6 C) (08/24 1936) Pulse Rate:  [60-89] 89 (08/24 2330) Resp:  [18] 18 (08/24 1936) BP: (135-144)/(79-93) 144/93 (08/24 2330) SpO2:  [97 %-100 %] 98 % (08/24 2330) Weight:  [90.7 kg] 90.7 kg (08/24 1937)     Height: 6\' 2"  (188 cm) Weight: 90.7 kg BMI (Calculated): 25.67   INTAKE/OUTPUT:  This shift: No intake/output data recorded.  Last 2 shifts: @IOLAST2SHIFTS @   PHYSICAL EXAM:  Constitutional:  -- Normal body habitus  -- Awake, alert, and oriented x3  Eyes:  -- Pupils equally round and reactive to light  -- No scleral icterus  Ear, nose, and throat:  -- No jugular venous distension  Pulmonary:  -- No crackles  -- Equal breath sounds bilaterally -- Breathing non-labored at rest Cardiovascular:  -- S1, S2 present  -- No pericardial rubs Gastrointestinal:  -- Abdomen soft, moderate tender right lower quadrant, non-distended, no guarding or rebound tenderness -- No abdominal masses appreciated, pulsatile or otherwise  Musculoskeletal and Integumentary:  -- Wounds or skin discoloration: None appreciated -- Extremities: B/L UE and LE FROM, hands and feet warm, no edema  Neurologic:  -- Motor function: intact and symmetric -- Sensation: intact and symmetric   Labs:  CBC Latest Ref Rng & Units 08/08/2019 09/26/2016 05/23/2016  WBC 4.0 - 10.5 K/uL 18.3(H)  10.3 -  Hemoglobin 13.0 - 17.0 g/dL 98.115.0 19.114.4 47.814.6  Hematocrit 39.0 - 52.0 % 43.6 43.4 43.0  Platelets 150 - 400 K/uL 280 216 -   CMP Latest Ref Rng & Units 08/08/2019 09/26/2016 05/23/2016  Glucose 70 - 99 mg/dL 295(A109(H) 88 -  BUN 6 - 20 mg/dL 11 9 -  Creatinine 2.130.61 - 1.24 mg/dL 0.860.89 5.780.87 -  Sodium 469135 - 145 mmol/L 138 139 -  Potassium 3.5 - 5.1 mmol/L 3.5 4.2 4.1  Chloride 98 - 111 mmol/L 101 102 -  CO2 22 - 32 mmol/L 24 29 -  Calcium 8.9 - 10.3 mg/dL 9.6 9.7 -  Total Protein  6.5 - 8.1 g/dL 7.8 6.6 -  Total Bilirubin 0.3 - 1.2 mg/dL 0.9 0.4 -  Alkaline Phos 38 - 126 U/L 63 45 -  AST 15 - 41 U/L 28 14 -  ALT 0 - 44 U/L 41 11 -   Imaging studies:  EXAM: CT ABDOMEN AND PELVIS WITH CONTRAST  TECHNIQUE: Multidetector CT imaging of the abdomen and pelvis was performed using the standard protocol following bolus administration of intravenous contrast.  CONTRAST:  100mL OMNIPAQUE IOHEXOL 300 MG/ML  SOLN  COMPARISON:  11/12/2013  FINDINGS: Lower chest: No acute abnormality.  Hepatobiliary: No focal liver abnormality is seen. No gallstones, gallbladder wall thickening, or biliary dilatation.  Pancreas: Unremarkable. No pancreatic ductal dilatation or surrounding inflammatory changes.  Spleen: Normal in size without focal abnormality.  Adrenals/Urinary Tract: Adrenal glands are unremarkable. Kidneys are normal, without renal calculi, focal lesion, or hydronephrosis. Bladder is unremarkable.  Stomach/Bowel: Stomach is nonenlarged. No dilated small bowel. Enlarged appendix, measuring up to 12 mm in size. Edema within the periappendiceal fat. No extraluminal gas.  Vascular/Lymphatic: No significant vascular findings are present. No enlarged abdominal or pelvic lymph nodes.  Reproductive: Prostate is unremarkable.  Other: Negative for free air or free fluid  Musculoskeletal: No acute or significant osseous findings.  IMPRESSION: Findings consistent with  acute appendicitis.  Appendix: Location: Right lower quadrant  Diameter: 12 mm  Appendicolith: Negative  Mucosal hyper-enhancement: Slight mucosal enhancement  Extraluminal gas: Negative  Periappendiceal collection: Negative   Electronically Signed   By: Jasmine PangKim  Fujinaga M.D.   On: 08/08/2019 22:51   Assessment/Plan:  28 y.o. male with negative anxieties.  Patient with history, physical exam and images consistent with acute appendicitis. Patient oriented about diagnosis and surgical management as treatment. Patient oriented about goals of surgery and its risk including: bowel injury, infection, abscess, bleeding, leak from cecum, intestinal adhesions, bowel obstruction, fistula, injury to the ureter among others.  Patient understood and agreed to proceed with surgery. Will admit patient, already started on antibiotic therapy, will give IV hydration since patient is NPO and schedule to OR.  Will wait for COVID-19 test results.  If negative will proceed with laparoscopic appendectomy, if positive will proceed with open appendectomy.  Gae GallopEdgardo Cintrn-Daz, MD

## 2019-08-08 NOTE — ED Notes (Signed)
Lab with updated time of 49 min left for COVID swab result

## 2019-08-09 ENCOUNTER — Encounter: Payer: Self-pay | Admitting: Anesthesiology

## 2019-08-09 ENCOUNTER — Encounter
Admission: EM | Disposition: A | Discharge status: Home / Self Care | Payer: Self-pay | Source: Home / Self Care | Attending: Emergency Medicine

## 2019-08-09 ENCOUNTER — Observation Stay: Payer: Self-pay | Admitting: Anesthesiology

## 2019-08-09 HISTORY — PX: LAPAROSCOPIC APPENDECTOMY: SHX408

## 2019-08-09 LAB — SARS CORONAVIRUS 2 BY RT PCR (HOSPITAL ORDER, PERFORMED IN ~~LOC~~ HOSPITAL LAB): SARS Coronavirus 2: NEGATIVE

## 2019-08-09 SURGERY — APPENDECTOMY, LAPAROSCOPIC
Anesthesia: General

## 2019-08-09 MED ORDER — SUCCINYLCHOLINE CHLORIDE 20 MG/ML IJ SOLN
INTRAMUSCULAR | Status: AC
  Filled 2019-08-09: qty 1

## 2019-08-09 MED ORDER — SUGAMMADEX SODIUM 200 MG/2ML IV SOLN
INTRAVENOUS | Status: AC
  Filled 2019-08-09: qty 2

## 2019-08-09 MED ORDER — LACTATED RINGERS IV SOLN
INTRAVENOUS | Status: DC
  Administered 2019-08-09 (×3): via INTRAVENOUS

## 2019-08-09 MED ORDER — FENTANYL CITRATE (PF) 100 MCG/2ML IJ SOLN
INTRAMUSCULAR | Status: AC
  Administered 2019-08-09: 25 ug via INTRAVENOUS
  Filled 2019-08-09: qty 2

## 2019-08-09 MED ORDER — SUGAMMADEX SODIUM 500 MG/5ML IV SOLN
INTRAVENOUS | Status: DC | PRN
  Administered 2019-08-09: 200 mg via INTRAVENOUS

## 2019-08-09 MED ORDER — EPHEDRINE SULFATE 50 MG/ML IJ SOLN
INTRAMUSCULAR | Status: AC
  Filled 2019-08-09: qty 1

## 2019-08-09 MED ORDER — MIDAZOLAM HCL 2 MG/2ML IJ SOLN
INTRAMUSCULAR | Status: AC
  Filled 2019-08-09: qty 2

## 2019-08-09 MED ORDER — DEXAMETHASONE SODIUM PHOSPHATE 10 MG/ML IJ SOLN
INTRAMUSCULAR | Status: DC | PRN
  Administered 2019-08-09: 10 mg via INTRAVENOUS

## 2019-08-09 MED ORDER — ONDANSETRON HCL 4 MG/2ML IJ SOLN
INTRAMUSCULAR | Status: AC
  Filled 2019-08-09: qty 2

## 2019-08-09 MED ORDER — LIDOCAINE HCL (PF) 2 % IJ SOLN
INTRAMUSCULAR | Status: AC
  Filled 2019-08-09: qty 10

## 2019-08-09 MED ORDER — HYDROCODONE-ACETAMINOPHEN 5-325 MG PO TABS: 1.0000 | ORAL_TABLET | ORAL | 0 refills | Status: DC | PRN

## 2019-08-09 MED ORDER — FENTANYL CITRATE (PF) 250 MCG/5ML IJ SOLN
INTRAMUSCULAR | Status: AC
  Filled 2019-08-09: qty 5

## 2019-08-09 MED ORDER — OXYCODONE HCL 5 MG PO TABS: 5.0000 mg | ORAL_TABLET | Freq: Once | ORAL | Status: DC | PRN

## 2019-08-09 MED ORDER — PROPOFOL 10 MG/ML IV BOLUS
INTRAVENOUS | Status: AC
  Filled 2019-08-09: qty 20

## 2019-08-09 MED ORDER — BUPIVACAINE-EPINEPHRINE 0.5% -1:200000 IJ SOLN
INTRAMUSCULAR | Status: DC | PRN
  Administered 2019-08-09: 14 mL

## 2019-08-09 MED ORDER — LIDOCAINE HCL (CARDIAC) PF 100 MG/5ML IV SOSY
PREFILLED_SYRINGE | INTRAVENOUS | Status: DC | PRN
  Administered 2019-08-09: 100 mg via INTRAVENOUS

## 2019-08-09 MED ORDER — OXYCODONE HCL 5 MG/5ML PO SOLN: 5.0000 mg | Freq: Once | ORAL | Status: DC | PRN

## 2019-08-09 MED ORDER — DEXAMETHASONE SODIUM PHOSPHATE 10 MG/ML IJ SOLN
INTRAMUSCULAR | Status: AC
  Filled 2019-08-09: qty 1

## 2019-08-09 MED ORDER — ACETAMINOPHEN 10 MG/ML IV SOLN
INTRAVENOUS | Status: AC
  Filled 2019-08-09: qty 100

## 2019-08-09 MED ORDER — PHENYLEPHRINE HCL (PRESSORS) 10 MG/ML IV SOLN
INTRAVENOUS | Status: AC
  Filled 2019-08-09: qty 1

## 2019-08-09 MED ORDER — FENTANYL CITRATE (PF) 100 MCG/2ML IJ SOLN
25.0000 ug | INTRAMUSCULAR | Status: DC | PRN
  Administered 2019-08-09: 25 ug via INTRAVENOUS
  Administered 2019-08-09: 50 ug via INTRAVENOUS
  Administered 2019-08-09: 03:00:00 25 ug via INTRAVENOUS

## 2019-08-09 MED ORDER — MIDAZOLAM HCL 2 MG/2ML IJ SOLN
INTRAMUSCULAR | Status: DC | PRN
  Administered 2019-08-09: 2 mg via INTRAVENOUS

## 2019-08-09 MED ORDER — SUCCINYLCHOLINE CHLORIDE 20 MG/ML IJ SOLN
INTRAMUSCULAR | Status: DC | PRN
  Administered 2019-08-09: 140 mg via INTRAVENOUS

## 2019-08-09 MED ORDER — ROCURONIUM BROMIDE 100 MG/10ML IV SOLN
INTRAVENOUS | Status: DC | PRN
  Administered 2019-08-09: 50 mg via INTRAVENOUS

## 2019-08-09 MED ORDER — ONDANSETRON HCL 4 MG/2ML IJ SOLN
INTRAMUSCULAR | Status: DC | PRN
  Administered 2019-08-09: 4 mg via INTRAVENOUS

## 2019-08-09 MED ORDER — BUPIVACAINE-EPINEPHRINE (PF) 0.5% -1:200000 IJ SOLN
INTRAMUSCULAR | Status: AC
  Filled 2019-08-09: qty 30

## 2019-08-09 MED ORDER — FENTANYL CITRATE (PF) 100 MCG/2ML IJ SOLN
INTRAMUSCULAR | Status: DC | PRN
  Administered 2019-08-09: 100 ug via INTRAVENOUS
  Administered 2019-08-09 (×3): 50 ug via INTRAVENOUS

## 2019-08-09 MED ORDER — PROPOFOL 10 MG/ML IV BOLUS
INTRAVENOUS | Status: DC | PRN
  Administered 2019-08-09: 200 mg via INTRAVENOUS

## 2019-08-09 MED ORDER — ACETAMINOPHEN 10 MG/ML IV SOLN
INTRAVENOUS | Status: DC | PRN
  Administered 2019-08-09: 1000 mg via INTRAVENOUS

## 2019-08-09 MED ORDER — ROCURONIUM BROMIDE 50 MG/5ML IV SOLN
INTRAVENOUS | Status: AC
  Filled 2019-08-09: qty 1

## 2019-08-09 SURGICAL SUPPLY — 49 items
ADH SKN CLS APL DERMABOND .7 (GAUZE/BANDAGES/DRESSINGS) ×1
APL PRP STRL LF DISP 70% ISPRP (MISCELLANEOUS) ×1
APPLIER CLIP LOGIC TI 5 (MISCELLANEOUS) IMPLANT
APR CLP MED LRG 33X5 (MISCELLANEOUS)
BLADE SURG SZ11 CARB STEEL (BLADE) ×3 IMPLANT
CANISTER SUCT 1200ML W/VALVE (MISCELLANEOUS) ×3 IMPLANT
CHLORAPREP W/TINT 26 (MISCELLANEOUS) ×3 IMPLANT
COVER WAND RF STERILE (DRAPES) ×1 IMPLANT
CUTTER FLEX LINEAR 45M (STAPLE) ×3 IMPLANT
DERMABOND ADVANCED (GAUZE/BANDAGES/DRESSINGS) ×2
DERMABOND ADVANCED .7 DNX12 (GAUZE/BANDAGES/DRESSINGS) ×1 IMPLANT
ELECT REM PT RETURN 9FT ADLT (ELECTROSURGICAL) ×3
ELECTRODE REM PT RTRN 9FT ADLT (ELECTROSURGICAL) ×1 IMPLANT
GLOVE BIO SURGEON STRL SZ 6.5 (GLOVE) ×3 IMPLANT
GLOVE BIO SURGEONS STRL SZ 6.5 (GLOVE) ×2
GLOVE BIOGEL PI IND STRL 6.5 (GLOVE) ×1 IMPLANT
GLOVE BIOGEL PI INDICATOR 6.5 (GLOVE) ×4
GOWN STRL REUS W/ TWL LRG LVL3 (GOWN DISPOSABLE) ×3 IMPLANT
GOWN STRL REUS W/TWL LRG LVL3 (GOWN DISPOSABLE) ×6
GRASPER SUT TROCAR 14GX15 (MISCELLANEOUS) ×3 IMPLANT
HANDLE YANKAUER SUCT BULB TIP (MISCELLANEOUS) ×3 IMPLANT
IRRIGATION STRYKERFLOW (MISCELLANEOUS) IMPLANT
IRRIGATOR STRYKERFLOW (MISCELLANEOUS) ×3
IV NS 1000ML (IV SOLUTION) ×3
IV NS 1000ML BAXH (IV SOLUTION) ×1 IMPLANT
KIT TURNOVER KIT A (KITS) ×3 IMPLANT
LIGASURE LAP MARYLAND 5MM 37CM (ELECTROSURGICAL) ×3 IMPLANT
NEEDLE HYPO 22GX1.5 SAFETY (NEEDLE) ×3 IMPLANT
NEEDLE VERESS 14GA 120MM (NEEDLE) ×3 IMPLANT
NS IRRIG 500ML POUR BTL (IV SOLUTION) ×3 IMPLANT
PACK LAP CHOLECYSTECTOMY (MISCELLANEOUS) ×3 IMPLANT
POUCH ENDO CATCH 10MM SPEC (MISCELLANEOUS) ×3 IMPLANT
RELOAD 45 VASCULAR/THIN (ENDOMECHANICALS) IMPLANT
RELOAD STAPLE 45 2.5 WHT GRN (ENDOMECHANICALS) IMPLANT
RELOAD STAPLE 45 3.5 BLU ETS (ENDOMECHANICALS) ×1 IMPLANT
RELOAD STAPLE TA45 3.5 REG BLU (ENDOMECHANICALS) ×3 IMPLANT
SCISSORS METZENBAUM CVD 33 (INSTRUMENTS) ×1 IMPLANT
SET TUBE SMOKE EVAC HIGH FLOW (TUBING) ×3 IMPLANT
SLEEVE ENDOPATH XCEL 5M (ENDOMECHANICALS) ×3 IMPLANT
SLEEVE SCD COMPRESS THIGH MED (MISCELLANEOUS) ×2 IMPLANT
SPONGE GAUZE 2X2 8PLY STER LF (GAUZE/BANDAGES/DRESSINGS) ×1
SPONGE GAUZE 2X2 8PLY STRL LF (GAUZE/BANDAGES/DRESSINGS) ×2 IMPLANT
SUT MNCRL AB 4-0 PS2 18 (SUTURE) ×3 IMPLANT
SUT VIC AB 2-0 SH 27 (SUTURE) ×3
SUT VIC AB 2-0 SH 27XBRD (SUTURE) IMPLANT
SUT VICRYL PLUS ABS 0 54 (SUTURE) ×3 IMPLANT
TRAY FOLEY MTR SLVR 16FR STAT (SET/KITS/TRAYS/PACK) ×3 IMPLANT
TROCAR XCEL 12X100 BLDLESS (ENDOMECHANICALS) ×3 IMPLANT
TROCAR XCEL NON-BLD 5MMX100MML (ENDOMECHANICALS) ×3 IMPLANT

## 2019-08-09 NOTE — Transfer of Care (Signed)
Immediate Anesthesia Transfer of Care Note  Patient: Lee Sandoval  Procedure(s) Performed: APPENDECTOMY LAPAROSCOPIC (N/A )  Patient Location: PACU  Anesthesia Type:General  Level of Consciousness: oriented and patient cooperative  Airway & Oxygen Therapy: Patient Spontanous Breathing and Patient connected to face mask oxygen  Post-op Assessment: Report given to RN and Post -op Vital signs reviewed and stable  Post vital signs: Reviewed and stable  Last Vitals:  Vitals Value Taken Time  BP    Temp    Pulse 100 08/09/19 0233  Resp 17 08/09/19 0233  SpO2 100 % 08/09/19 0233  Vitals shown include unvalidated device data.  Last Pain:  Vitals:   08/08/19 2348  TempSrc:   PainSc: 6          Complications: No apparent anesthesia complications

## 2019-08-09 NOTE — Op Note (Signed)
Preoperative diagnosis: Acute appendicitis.  Postoperative diagnosis: Acute appendicitis  Procedure: Laparoscopic appendectomy.  Anesthesia: GETA  Surgeon: Dr. Windell Moment, MD  Wound Classification: Contaminated  Indications: Patient is a 28 y.o. male  presented with right lower quadrant pain of 1 day of duration, elevated WBC. Computed tomography scan and physical examination were consistent with acute appendicitis.   Findings: 1. Acutely inflamed appendix 2. No peri-appendiceal abscess or phlegmon 3. Normal anatomy 4. Adequate hemostasis.   Description of procedure: The patient was placed on the operating table in the supine position. General anesthesia was induced. A time-out was completed verifying correct patient, procedure, site, positioning, and implant(s) and/or special equipment prior to beginning this procedure. A Foley catheter and orogastric tubes were placed. The abdomen was prepped and draped in the usual sterile fashion.  An incision was made in a natural skin line above the umbilicus.   The fascia was elevated and the Veress needle inserted. Proper position was confirmed by aspiration and saline meniscus test. The abdomen was insufflated with carbon dioxide to a pressure of 15 mmHg. The patient tolerated insufflation well. A 5-mm optiview trocar was then inserted supraumbilically. The laparoscope was inserted and the abdomen inspected. No injuries from initial trocar placement were noted. Turbid fluid was noted in the right lower quadrant. Under direct visualization, an 12-mm trocar was inserted in the left lower quadrant lateral to the rectus muscle. A 5-mm port was then placed above the symphysis pubis on midline.  Care was taken to avoid injury to the bladder or inferior epigastric vessels. The table was placed in the Trendelenburg position with the right side elevated.  The cecum was gently grasped with an endoscopic graspers and pulled toward (the left upper quadrant).  An atraumatic grasper was then passed through the suprapubic port and omentum was dissected away until the appendix was identified. The appendix was then grasped and elevated. It was noted to be inflamed.  An endoscopic linear cutting stapler was then used to divide and staple the base of the appendix. The mesoappendix was divided with LigaSure device.  The appendix was placed in an endoscopic retrieval bag and removed.  The appendiceal stump was then irrigated and hemostasis was assured. Fluid was suctioned and no other pathology was identified.  Secondary trocars were removed under direct vision. No bleeding was noted. The laparoscope was withdrawn and the umbilical trocar removed. The abdomen was allowed to collapse. All trocar sites greater than 5 mm were closed with Vicryl 0. The skin was closed with subcuticular sutures Monocryl 3-0 of and steristrips.  The patient tolerated the procedure well and was taken to the postanesthesia care unit in satisfactory condition.   Specimen: Appendix  Complications: None  Estimated Blood Loss: 5 mL

## 2019-08-09 NOTE — ED Notes (Signed)
ED TO INPATIENT HANDOFF REPORT  ED Nurse Name and Phone #: Betti Cruz Name/Age/Gender Lee Sandoval 28 y.o. male Room/Bed: EDOTFA/EDOTF  Code Status   Code Status: Full Code  Home/SNF/Other Home Patient oriented to: self, place, time and situation Is this baseline? Yes   Triage Complete: Triage complete  Chief Complaint abd pain, vomiting  Triage Note Pt to triage via w/c, appears uncomfortable; c/o lower abd pain since 8am; st V x 5 with 4 BM today; denies hx of same   Allergies Allergies  Allergen Reactions  . Sulfa Antibiotics Hives    High fever.   . Tramadol Hcl Other (See Comments)    Reaction: unknown    Level of Care/Admitting Diagnosis ED Disposition    ED Disposition Condition Comment   Admit  Hospital Area: [facility] [[id number]]  Level of Care: Med-Surg [16]  Covid Evaluation: Asymptomatic Screening Protocol (No Symptoms)  Diagnosis: Acute appendicitis with localized peritonitis [024097]  Admitting Physician: Carolan Shiver [[id number]]  Attending Physician: Carolan Shiver [[id number]]  PT Class (Do Not Modify): Observation [104]  PT Acc Code (Do Not Modify): Observation [10022]       B Medical/Surgery History Past Medical History:  Diagnosis Date  . Attention deficit hyperactivity disorder (ADHD), combined type, mild   . Generalized anxiety disorder    Past Surgical History:  Procedure Laterality Date  . TONSILLECTOMY    . TOOTH EXTRACTION       A IV Location/Drains/Wounds Patient Lines/Drains/Airways Status   Active Line/Drains/Airways    Name:   Placement date:   Placement time:   Site:   Days:   Peripheral IV 08/08/19 Right Forearm   08/08/19    2150    Forearm   1   Peripheral IV 08/08/19 Left Antecubital   08/08/19    2234    Antecubital   1          Intake/Output Last 24 hours No intake or output data in the 24 hours ending 08/09/19 0100  Labs/Imaging Results for orders placed or performed  during the hospital encounter of 08/08/19 (from the past 48 hour(s))  CBC with Differential     Status: Abnormal   Collection Time: 08/08/19  7:42 PM  Result Value Ref Range   WBC 18.3 (H) 4.0 - 10.5 K/uL   RBC 5.03 4.22 - 5.81 MIL/uL   Hemoglobin 15.0 13.0 - 17.0 g/dL   HCT 96.2 22.9 - 79.8 %   MCV 86.7 80.0 - 100.0 fL   MCH 29.8 26.0 - 34.0 pg   MCHC 34.4 30.0 - 36.0 g/dL   RDW 92.1 19.4 - 17.4 %   Platelets 280 150 - 400 K/uL   nRBC 0.0 0.0 - 0.2 %   Neutrophils Relative % 82 %   Neutro Abs 14.8 (H) 1.7 - 7.7 K/uL   Lymphocytes Relative 11 %   Lymphs Abs 2.1 0.7 - 4.0 K/uL   Monocytes Relative 7 %   Monocytes Absolute 1.3 (H) 0.1 - 1.0 K/uL   Eosinophils Relative 0 %   Eosinophils Absolute 0.0 0.0 - 0.5 K/uL   Basophils Relative 0 %   Basophils Absolute 0.0 0.0 - 0.1 K/uL   Immature Granulocytes 0 %   Abs Immature Granulocytes 0.07 0.00 - 0.07 K/uL    Comment: Performed at [facility], [address], [place], [place] [address]  Comprehensive metabolic panel     Status: Abnormal   Collection Time: 08/08/19  7:42 PM  Result Value Ref Range   Sodium 138 135 - 145 mmol/L   Potassium 3.5 3.5 - 5.1 mmol/L   Chloride 101 98 - 111 mmol/L   CO2 24 22 - 32 mmol/L   Glucose, Bld 109 (H) 70 - 99 mg/dL   BUN 11 6 - 20 mg/dL   Creatinine, Ser 0.89 0.61 - 1.24 mg/dL   Calcium 9.6 8.9 - 10.3 mg/dL   Total Protein 7.8 6.5 - 8.1 g/dL   Albumin 4.7 3.5 - 5.0 g/dL   AST 28 15 - 41 U/L   ALT 41 0 - 44 U/L   Alkaline Phosphatase 63 38 - 126 U/L   Total Bilirubin 0.9 0.3 - 1.2 mg/dL   GFR calc non Af Amer >60 >60 mL/min   GFR calc Af Amer >60 >60 mL/min   Anion gap 13 5 - 15    Comment: Performed at [facility], [address] [place], [place] [address]  Lipase, blood     Status: None   Collection Time: 08/08/19  7:42 PM  Result Value Ref Range   Lipase 19 11 - 51 U/L    Comment: Performed at [facility], [address] [place], [place] [address]   SARS Coronavirus 2 [facility] order, Performed in [facility] hospital lab) Nasopharyngeal Nasopharyngeal Swab     Status: None   Collection Time: 08/08/19 10:59 PM   Specimen: Nasopharyngeal Swab  Result Value Ref Range   SARS Coronavirus 2 NEGATIVE NEGATIVE    Comment: (NOTE) If result is NEGATIVE SARS-CoV-2 target nucleic acids are NOT DETECTED. The SARS-CoV-2 RNA is generally detectable in upper and lower  respiratory specimens during the acute phase of infection. The lowest  concentration of SARS-CoV-2 viral copies this assay can detect is 250  copies / mL. A negative result does not preclude SARS-CoV-2 infection  and should not be used as the sole basis for treatment or other  patient management decisions.  A negative result may occur with  improper specimen collection / handling, submission of specimen other  than nasopharyngeal swab, presence of viral mutation(s) within the  areas targeted by this assay, and inadequate number of viral copies  (<250 copies / mL). A negative result must be combined with clinical  observations, patient history, and epidemiological information. If result is POSITIVE SARS-CoV-2 target nucleic acids are DETECTED. The SARS-CoV-2 RNA is generally detectable in upper and lower  respiratory specimens dur ing the acute phase of infection.  Positive  results are indicative of active infection with SARS-CoV-2.  Clinical  correlation with patient history and other diagnostic information is  necessary to determine patient infection status.  Positive results do  not rule out bacterial infection or co-infection with other viruses. If result is PRESUMPTIVE POSTIVE SARS-CoV-2 nucleic acids MAY BE PRESENT.   A presumptive positive result was obtained on the submitted specimen  and confirmed on repeat testing.  While 2019 novel coronavirus  (SARS-CoV-2) nucleic acids may be present in the submitted sample  additional confirmatory testing may be necessary for  epidemiological  and / or clinical management purposes  to differentiate between  SARS-CoV-2 and other Sarbecovirus currently known to infect humans.  If clinically indicated additional testing with an alternate test  methodology [phone]) is advised. The SARS-CoV-2 RNA is generally  detectable in upper and lower respiratory sp ecimens during the acute  phase of infection. The expected result is Negative. Fact Sheet for Patients:  StrictlyIdeas.no Fact Sheet for Healthcare Providers: BankingDealers.co.za This  test is not yet approved or cleared by the [place] FDA and has been authorized for detection and/or diagnosis of SARS-CoV-2 by FDA under an Emergency Use Authorization (EUA).  This EUA will remain in effect (meaning this test can be used) for the duration of the COVID-19 declaration under Section 564(b)(1) of the Act, 21 U.S.C. section 360bbb-3(b)(1), unless the authorization is terminated or revoked sooner. Performed at [facility], [address] [place], [place] [address]    Ct Abdomen Pelvis W Contrast  Result Date: 08/08/2019 CLINICAL DATA:  Nausea vomiting and lower abdominal pain EXAM: CT ABDOMEN AND PELVIS WITH CONTRAST TECHNIQUE: Multidetector CT imaging of the abdomen and pelvis was performed using the standard protocol following bolus administration of intravenous contrast. CONTRAST:  100mL OMNIPAQUE IOHEXOL 300 MG/ML  SOLN COMPARISON:  11/12/2013 FINDINGS: Lower chest: No acute abnormality. Hepatobiliary: No focal liver abnormality is seen. No gallstones, gallbladder wall thickening, or biliary dilatation. Pancreas: Unremarkable. No pancreatic ductal dilatation or surrounding inflammatory changes. Spleen: Normal in size without focal abnormality. Adrenals/Urinary Tract: Adrenal glands are unremarkable. Kidneys are normal, without renal calculi, focal lesion, or hydronephrosis. Bladder is unremarkable.  Stomach/Bowel: Stomach is nonenlarged. No dilated small bowel. Enlarged appendix, measuring up to 12 mm in size. Edema within the periappendiceal fat. No extraluminal gas. Vascular/Lymphatic: No significant vascular findings are present. No enlarged abdominal or pelvic lymph nodes. Reproductive: Prostate is unremarkable. Other: Negative for free air or free fluid Musculoskeletal: No acute or significant osseous findings. IMPRESSION: Findings consistent with acute appendicitis. Appendix: Location: Right lower quadrant Diameter: 12 mm Appendicolith: Negative Mucosal hyper-enhancement: Slight mucosal enhancement Extraluminal gas: Negative Periappendiceal collection: Negative Electronically Signed   By: Jasmine PangKim  Fujinaga M.D.   On: 08/08/2019 22:51    Pending Labs Unresulted Labs (From admission, onward)    Start     Ordered   08/08/19 2358  HIV antibody (Routine Testing)  Once,   STAT     08/08/19 2357   08/08/19 1940  Urinalysis, Complete w Microscopic  Once,   STAT     08/08/19 1940          Vitals/Pain Today's Vitals   08/08/19 2330 08/08/19 2348 08/09/19 0000 08/09/19 0030  BP: (!) 144/93  132/72 133/70  Pulse: 89  83 62  Resp:      Temp:      TempSrc:      SpO2: 98%  98% 100%  Weight:      Height:      PainSc:  6       Isolation Precautions No active isolations  Medications Medications  0.9 %  sodium chloride infusion (has no administration in time range)  enoxaparin (LOVENOX) injection 40 mg (has no administration in time range)  cefTRIAXone (ROCEPHIN) 2 g in sodium chloride 0.9 % 100 mL IVPB (has no administration in time range)    And  metroNIDAZOLE (FLAGYL) IVPB 500 mg (has no administration in time range)  acetaminophen (TYLENOL) tablet 650 mg (has no administration in time range)    Or  acetaminophen (TYLENOL) suppository 650 mg (has no administration in time range)  HYDROcodone-acetaminophen (NORCO/VICODIN) 5-325 MG per tablet 1-2 tablet (has no administration in time  range)  morphine 4 MG/ML injection 4 mg (has no administration in time range)  ondansetron (ZOFRAN-ODT) disintegrating tablet 4 mg (has no administration in time range)    Or  ondansetron (ZOFRAN) injection 4 mg (has no administration in time range)  morphine 4 MG/ML injection 6 mg (6  mg Intravenous Given 08/08/19 2212)  ondansetron (ZOFRAN) injection 4 mg (4 mg Intravenous Given 08/08/19 2212)  iohexol (OMNIPAQUE) 300 MG/ML solution 100 mL (100 mLs Intravenous Contrast Given 08/08/19 2227)  cefTRIAXone (ROCEPHIN) 2 g in sodium chloride 0.9 % 100 mL IVPB (0 g Intravenous Stopped 08/08/19 2346)    And  metroNIDAZOLE (FLAGYL) IVPB 500 mg (0 mg Intravenous Stopped 08/09/19 0012)  sodium chloride 0.9 % bolus 1,000 mL (0 mLs Intravenous Stopped 08/09/19 0058)  HYDROmorphone (DILAUDID) injection 1 mg (1 mg Intravenous Given 08/08/19 2318)  midazolam (VERSED) 2 MG/2ML injection (has no administration in time range)  fentaNYL (SUBLIMAZE) 250 MCG/5ML injection (has no administration in time range)  propofol (DIPRIVAN) 10 mg/mL bolus/IV push (has no administration in time range)  succinylcholine (ANECTINE) 20 MG/ML injection (has no administration in time range)  lidocaine (XYLOCAINE) 2 % injection (has no administration in time range)  rocuronium (ZEMURON) 50 MG/5ML injection (has no administration in time range)  ePHEDrine 50 MG/ML injection (has no administration in time range)  phenylephrine (NEO-SYNEPHRINE) 10 MG/ML injection (has no administration in time range)  dexamethasone (DECADRON) 10 MG/ML injection (has no administration in time range)  ondansetron (ZOFRAN) 4 MG/2ML injection (has no administration in time range)  sugammadex sodium (BRIDION) 200 MG/2ML injection (has no administration in time range)    Mobility walks Low fall risk   Focused Assessments Pulmonary Assessment Handoff:  Lung sounds:   O2 Device: Room Air        R Recommendations: See Admitting Provider  Note  Report given to:   Additional Notes: appendicitis, COVID -

## 2019-08-09 NOTE — Discharge Summary (Signed)
  Patient ID: Lee Sandoval MRN: [id number] DOB/AGE: 30-28-1992 30 y.o.  Admit date: 08/08/2019 Discharge date: 08/09/2019   Discharge Diagnoses:  Active Problems:   Acute appendicitis with localized peritonitis   Procedures: Laparoscopic appendectomy  Hospital Course: Patient with acute appendicitis. He underwent lap appendectomy and tolerated procedure well. Patient ambulating and tolerating soft diet. Wounds dry and clean.   Physical Exam  Constitutional: He is oriented to person, place, and time and well-developed, well-nourished, and in no distress.  Cardiovascular: Normal rate.  Abdominal: Soft. Bowel sounds are normal. He exhibits no distension. There is no rebound.  Musculoskeletal: Normal range of motion.  Neurological: He is alert and oriented to person, place, and time.  Skin: Skin is warm.     Consults: None  Disposition: Discharge disposition: 01-Home or Self Care       Discharge Instructions    Diet - low sodium heart healthy   Complete by: As directed      Allergies as of 08/09/2019      Reactions   Sulfa Antibiotics Hives   High fever.    Tramadol Hcl Other (See Comments)   Reaction: unknown      Medication List    TAKE these medications   HYDROcodone-acetaminophen 5-325 MG tablet Commonly known as: NORCO/VICODIN Take 1-2 tablets by mouth every 4 (four) hours as needed for moderate pain.      Follow-up Information    Herbert Pun, MD Follow up in 2 week(s).   Specialty: General Surgery Contact information: [address] [place] [address] [phone]

## 2019-08-09 NOTE — Plan of Care (Signed)
  Problem: Education: Goal: Knowledge of General Education information will improve Description: Including pain rating scale, medication(s)/side effects and non-pharmacologic comfort measures Outcome: Progressing   Problem: Health Behavior/Discharge Planning: Goal: Ability to manage health-related needs will improve Outcome: Progressing   Problem: Clinical Measurements: Goal: Ability to maintain clinical measurements within normal limits will improve Outcome: Progressing Goal: Will remain free from infection Outcome: Progressing Goal: Diagnostic test results will improve Outcome: Progressing Goal: Cardiovascular complication will be avoided Outcome: Progressing   Problem: Activity: Goal: Risk for activity intolerance will decrease Outcome: Progressing   Problem: Pain Managment: Goal: General experience of comfort will improve Outcome: Progressing

## 2019-08-09 NOTE — Discharge Instructions (Signed)
Appendicitis, Adult  The appendix is a tube in the body that is shaped like a finger. It is attached to the large intestine. Appendicitis means that this tube is swollen (inflamed). If this is not treated, the tube can tear (rupture). This can lead to a life-threatening infection. This condition can also cause pus to build up in the appendix (abscess). What are the causes? This condition may be caused by something that blocks the appendix. These include:  A ball of poop (stool).  Lymph glands that are bigger than normal. Sometimes the cause is not known. What increases the risk? You are more likely to develop this condition if you are between 67 and 2 years of age. What are the signs or symptoms? Symptoms of this condition include:  Pain around the belly button (navel). ? The pain moves toward the lower right belly (abdomen). ? The pain can get worse with time. ? The pain can get worse if you cough. ? The pain can get worse if you move suddenly.  Tenderness in the lower right belly.  Feeling sick to your stomach (nauseous).  Throwing up (vomiting).  Not feeling hungry (loss of appetite).  A fever.  Having trouble pooping (constipation).  Watery poop (diarrhea).  Not feeling well. How is this treated? Most often, this condition is treated by taking out the appendix (appendectomy). There are two ways to do this:  Open surgery. For this method, the appendix is taken out through a large cut (incision). The cut is made in the lower right belly. This surgery may be used if: ? You have scars from another surgery. ? You have a bleeding condition. ? You are pregnant and will be having your baby soon. ? You have a condition that makes it hard to do the other type of surgery.  Laparoscopic surgery. For this method, the appendix is taken out through small cuts. Often, this surgery: ? Causes less pain. ? Causes fewer problems. ? Is easier to heal from. If your appendix tears  and pus forms:  A drain may be put into the sore. The drain will be used to get rid of the pus.  You may get an antibiotic medicine through an IV line.  Your appendix may or may not need to be taken out. Follow these instructions at home: If you had surgery, follow instructions from your doctor on how to care for yourself at home and how to take care of your cut from surgery. Medicines  Take over-the-counter and prescription medicines only as told by your doctor.  If you were prescribed an antibiotic medicine, take it as told by your doctor. Do not stop taking the antibiotic even if you start to feel better. Eating and drinking Follow instructions from your doctor about what you cannot eat or drink. You may go back to your diet slowly if:  You no longer feel sick to your stomach.  You have stopped throwing up. General instructions  Do not use any products that contain nicotine or tobacco, such as cigarettes, e-cigarettes, and chewing tobacco. If you need help quitting, ask your doctor.  Do not drive or use heavy machinery while taking prescription pain medicine.  Ask your doctor if the medicine you are taking can cause trouble pooping. You may need to take steps to prevent or treat trouble pooping: ? Drink enough fluid to keep your pee (urine) pale yellow. ? Take over-the-counter or prescription medicines. ? Eat foods that are high in fiber. These include  beans, whole grains, and fresh fruits and vegetables. ? Limit foods that are high in fat and sugar. These include fried or sweet foods.  Keep all follow-up visits as told by your doctor. This is important. Contact a doctor if:  There is pus, blood, or a lot of fluid coming from your cut or cuts from surgery.  You are sick to your stomach or you throw up. Get help right away if:  You have pain in your belly, and the pain is getting worse.  You have a fever.  You have chills.  You are very tired.  You have muscle  pain.  You are short of breath. Summary  Appendicitis is swelling of the appendix. The appendix is a tube that is shaped like a finger. It is joined to the large intestine.  This condition may be caused by something that blocks the appendix. This can lead to an infection.  This condition is usually treated by taking out the appendix. This information is not intended to replace advice given to you by your health care provider. Make sure you discuss any questions you have with your health care provider. Document Released: 02/23/2012 Document Revised: 05/19/2018 Document Reviewed: 05/19/2018 Elsevier Patient Education  [employer].   Laparoscopic Appendectomy, Adult, Care After This sheet gives you information about how to care for yourself after your procedure. Your health care provider may also give you more specific instructions. If you have problems or questions, contact your health care provider. What can I expect after the procedure? After the procedure, it is common to have:  Little energy for normal activities.  Mild pain in the area where the incisions were made.  Difficulty passing stool (constipation). This can be caused by: ? Pain medicine. ? A decrease in your activity. Follow these instructions at home: Medicines  Take over-the-counter and prescription medicines only as told by your health care provider.  If you were prescribed an antibiotic medicine, take it as told by your health care provider. Do not stop taking the antibiotic even if you start to feel better.  Do not drive or use heavy machinery while taking prescription pain medicine.  Ask your health care provider if the medicine prescribed to you can cause constipation. You may need to take steps to prevent or treat constipation, such as: ? Drink enough fluid to keep your urine pale yellow. ? Take over-the-counter or prescription medicines. ? Eat foods that are high in fiber, such as beans, whole  grains, and fresh fruits and vegetables. ? Limit foods that are high in fat and processed sugars, such as fried or sweet foods. Incision care   Follow instructions from your health care provider about how to take care of your incisions. Make sure you: ? Wash your hands with soap and water before and after you change your bandage (dressing). If soap and water are not available, use hand sanitizer. ? Change your dressing as told by your health care provider. ? Leave stitches (sutures), skin glue, or adhesive strips in place. These skin closures may need to stay in place for 2 weeks or longer. If adhesive strip edges start to loosen and curl up, you may trim the loose edges. Do not remove adhesive strips completely unless your health care provider tells you to do that.  Check your incision areas every day for signs of infection. Check for: ? Redness, swelling, or pain. ? Fluid or blood. ? Warmth. ? Pus or a bad smell. Bathing  Keep  your incisions clean and dry. Clean them as often as told by your health care provider. To do this: 1. Gently wash the incisions with soap and water. 2. Rinse the incisions with water to remove all soap. 3. Pat the incisions dry with a clean towel. Do not rub the incisions.  Do not take baths, swim, or use a hot tub for 2 weeks, or until your health care provider approves. You may take showers after 48 hours. Activity   Do not drive for 24 hours if you were given a sedative during your procedure.  Rest after the procedure. Return to your normal activities as told by your health care provider. Ask your health care provider what activities are safe for you.  For 3 weeks, or for as long as told by your health care provider: ? Do not lift anything that is heavier than 10 lb (4.5 kg), or the limit that you are told. ? Do not play contact sports. General instructions  If you were sent home with a drain, follow instructions from your health care provider about how  to care for it.  Take deep breaths. This helps to prevent your lungs from developing an infection (pneumonia).  Keep all follow-up visits as told by your health care provider. This is important. Contact a health care provider if:  You have redness, swelling, or pain around an incision.  You have fluid or blood coming from an incision.  Your incision feels warm to the touch.  You have pus or a bad smell coming from an incision or dressing.  Your incision edges break open after your sutures have been removed.  You have increasing pain in your shoulders.  You feel dizzy or you faint.  You develop shortness of breath.  You keep feeling nauseous or you are vomiting.  You have diarrhea or you cannot control your bowel functions.  You lose your appetite.  You develop swelling or pain in your legs.  You develop a rash. Get help right away if you have:  A fever.  Difficulty breathing.  Sharp pains in your chest. Summary  After a laparoscopic appendectomy, it is common to have little energy for normal activities, mild pain in the area of the incisions, and constipation.  Infection is the most common complication after this procedure. Follow your health care provider's instructions about caring for yourself after the procedure.  Rest after the procedure. Return to your normal activities as told by your health care provider.  Contact your health care provider if you notice signs of infection around your incisions or you develop shortness of breath. Get help right away if you have a fever, chest pain, or difficulty breathing. This information is not intended to replace advice given to you by your health care provider. Make sure you discuss any questions you have with your health care provider. Document Released: 12/01/2005 Document Revised: 06/03/2018 Document Reviewed: 06/03/2018 Elsevier Patient Education  2020 [employer].  Diet: Resume home heart healthy regular diet.    Activity: No heavy lifting >20 pounds (children, pets, laundry, garbage) or strenuous activity until follow-up, but light activity and walking are encouraged. Do not drive or drink alcohol if taking narcotic pain medications.  Wound care: May shower with soapy water and pat dry (do not rub incisions), but no baths or submerging incision underwater until follow-up. (no swimming)   Medications: Resume all home medications. For mild to moderate pain: acetaminophen (Tylenol) or ibuprofen (if no kidney disease). Combining Tylenol with  alcohol can substantially increase your risk of causing liver disease. Narcotic pain medications, if prescribed, can be used for severe pain, though may cause nausea, constipation, and drowsiness. Do not combine Tylenol and Norco within a 6 hour period as Norco contains Tylenol. If you do not need the narcotic pain medication, you do not need to fill the prescription.  May use heating pad for the shoulder pain.   Call office [phone] at any time if any questions, worsening pain, fevers/chills, bleeding, drainage from incision site, or other concerns.

## 2019-08-09 NOTE — Anesthesia Post-op Follow-up Note (Signed)
Anesthesia QCDR form completed.        

## 2019-08-09 NOTE — ED Notes (Signed)
Report given to Larena Glassman, RN on 1C

## 2019-08-09 NOTE — Anesthesia Postprocedure Evaluation (Signed)
Anesthesia Post Note  Patient: Lee Sandoval  Procedure(s) Performed: APPENDECTOMY LAPAROSCOPIC (N/A )  Patient location during evaluation: PACU Anesthesia Type: General Level of consciousness: awake and alert Pain management: pain level controlled Vital Signs Assessment: post-procedure vital signs reviewed and stable Respiratory status: spontaneous breathing, nonlabored ventilation, respiratory function stable and patient connected to nasal cannula oxygen Cardiovascular status: blood pressure returned to baseline and stable Postop Assessment: no apparent nausea or vomiting Anesthetic complications: no     Last Vitals:  Vitals:   08/09/19 0320 08/09/19 0333  BP: (!) 119/56 127/89  Pulse: 80 79  Resp: 19   Temp:  36.7 C  SpO2: 95% 98%    Last Pain:  Vitals:   08/09/19 0333  TempSrc: Oral  PainSc: 1                  Precious Haws Piscitello

## 2019-08-09 NOTE — Anesthesia Preprocedure Evaluation (Signed)
Anesthesia Evaluation  Patient identified by MRN, date of birth, ID band Patient awake    Reviewed: Allergy & Precautions, H&P , NPO status , Patient's Chart, lab work & pertinent test results  History of Anesthesia Complications Negative for: history of anesthetic complications  Airway Mallampati: II  TM Distance: >3 FB Neck ROM: full    Dental  (+) Poor Dentition, Missing, Edentulous Upper, Edentulous Lower   Pulmonary neg shortness of breath, Current Smoker and Patient abstained from smoking., former smoker,           Cardiovascular Exercise Tolerance: Good (-) angina(-) Past MI and (-) DOE negative cardio ROS       Neuro/Psych PSYCHIATRIC DISORDERS negative neurological ROS     GI/Hepatic Neg liver ROS, GERD  Medicated and Controlled,  Endo/Other  negative endocrine ROS  Renal/GU      Musculoskeletal   Abdominal   Peds  Hematology negative hematology ROS (+)   Anesthesia Other Findings Past Medical History: No date: Attention deficit hyperactivity disorder (ADHD), combined  type, mild No date: Generalized anxiety disorder  Past Surgical History: No date: TONSILLECTOMY No date: TOOTH EXTRACTION  BMI    Body Mass Index: 25.68 kg/m      Reproductive/Obstetrics negative OB ROS                             Anesthesia Physical Anesthesia Plan  ASA: II and emergent  Anesthesia Plan: General ETT   Post-op Pain Management:    Induction: Intravenous  PONV Risk Score and Plan: Ondansetron, Dexamethasone, Midazolam and Treatment may vary due to age or medical condition  Airway Management Planned: Oral ETT  Additional Equipment:   Intra-op Plan:   Post-operative Plan: Extubation in OR  Informed Consent: I have reviewed the patients History and Physical, chart, labs and discussed the procedure including the risks, benefits and alternatives for the proposed anesthesia with  the patient or authorized representative who has indicated his/her understanding and acceptance.     Dental Advisory Given  Plan Discussed with: Anesthesiologist, CRNA and Surgeon  Anesthesia Plan Comments: (Patient consented for risks of anesthesia including but not limited to:  - adverse reactions to medications - damage to teeth, lips or other oral mucosa - sore throat or hoarseness - Damage to heart, brain, lungs or loss of life  Patient voiced understanding.)        Anesthesia Quick Evaluation

## 2019-08-09 NOTE — Plan of Care (Signed)
Pt admitted to unit following appendectomy. Pt alert and oriented. Pt education on surgical procedure, pain management, and discharge expectations. Pt asked questions and acknowledges information. Pdowless, rn

## 2019-08-09 NOTE — ED Notes (Signed)
Notified Kimberly in [place] of negative COVID swab

## 2019-08-09 NOTE — ED Notes (Addendum)
Report given to [facility] in the OR Patient to sign consent after surgeon discusses risks v benefits

## 2019-08-09 NOTE — Progress Notes (Signed)
Pt for discharge home. A/o no distress. Instructions discussed with pt and wife.  meds diet activity and f/u discussed.  Sl d/cd x2.  Home via w/c at this time w/o c/o.

## 2019-08-09 NOTE — Anesthesia Procedure Notes (Signed)
Procedure Name: Intubation Date/Time: 08/09/2019 1:29 AM Performed by: Lendon Colonel, CRNA Pre-anesthesia Checklist: Patient identified, Patient being monitored, Timeout performed, Emergency Drugs available and Suction available Patient Re-evaluated:Patient Re-evaluated prior to induction Oxygen Delivery Method: Circle system utilized Preoxygenation: Pre-oxygenation with 100% oxygen Induction Type: IV induction and Rapid sequence Laryngoscope Size: Miller and 2 Grade View: Grade I Tube type: Oral Tube size: 7.5 mm Number of attempts: 1 Airway Equipment and Method: Stylet Placement Confirmation: ETT inserted through vocal cords under direct vision,  positive ETCO2 and breath sounds checked- equal and bilateral Secured at: 20 cm Tube secured with: Tape Dental Injury: Teeth and Oropharynx as per pre-operative assessment

## 2019-08-10 LAB — SURGICAL PATHOLOGY

## 2019-08-10 LAB — HIV ANTIBODY (ROUTINE TESTING W REFLEX): HIV Screen 4th Generation wRfx: NONREACTIVE

## 2019-08-10 NOTE — Addendum Note (Signed)
Addendum  created 08/10/19 1033 by Doreen Salvage, CRNA   Charge Capture section accepted

## 2019-11-20 ENCOUNTER — Encounter: Payer: Self-pay | Admitting: Intensive Care

## 2019-11-20 ENCOUNTER — Other Ambulatory Visit: Payer: Self-pay

## 2019-11-20 ENCOUNTER — Emergency Department
Admission: EM | Admit: 2019-11-20 | Discharge: 2019-11-20 | Disposition: A | Discharge status: Home / Self Care | Payer: No Typology Code available for payment source | Attending: Emergency Medicine | Admitting: Emergency Medicine

## 2019-11-20 ENCOUNTER — Emergency Department: Payer: No Typology Code available for payment source

## 2019-11-20 DIAGNOSIS — M5441 Lumbago with sciatica, right side: Secondary | ICD-10-CM | POA: Insufficient documentation

## 2019-11-20 DIAGNOSIS — M5442 Lumbago with sciatica, left side: Secondary | ICD-10-CM | POA: Diagnosis not present

## 2019-11-20 DIAGNOSIS — Z87891 Personal history of nicotine dependence: Secondary | ICD-10-CM | POA: Insufficient documentation

## 2019-11-20 DIAGNOSIS — Z79899 Other long term (current) drug therapy: Secondary | ICD-10-CM | POA: Insufficient documentation

## 2019-11-20 DIAGNOSIS — M549 Dorsalgia, unspecified: Secondary | ICD-10-CM

## 2019-11-20 DIAGNOSIS — M545 Low back pain: Secondary | ICD-10-CM | POA: Diagnosis present

## 2019-11-20 MED ORDER — CYCLOBENZAPRINE HCL 10 MG PO TABS: 10.0000 mg | ORAL_TABLET | Freq: Three times a day (TID) | ORAL | 0 refills | Status: DC | PRN

## 2019-11-20 MED ORDER — DEXAMETHASONE SODIUM PHOSPHATE 10 MG/ML IJ SOLN
10.0000 mg | Freq: Once | INTRAMUSCULAR | Status: AC
  Administered 2019-11-20: 10 mg via INTRAMUSCULAR
  Filled 2019-11-20: qty 1

## 2019-11-20 MED ORDER — CYCLOBENZAPRINE HCL 10 MG PO TABS
10.0000 mg | ORAL_TABLET | Freq: Once | ORAL | Status: AC
  Administered 2019-11-20: 10 mg via ORAL
  Filled 2019-11-20: qty 1

## 2019-11-20 MED ORDER — PREDNISONE 10 MG PO TABS: ORAL_TABLET | ORAL | 0 refills | Status: DC

## 2019-11-20 MED ORDER — HYDROCODONE-ACETAMINOPHEN 5-325 MG PO TABS
1.0000 | ORAL_TABLET | Freq: Once | ORAL | Status: AC
  Administered 2019-11-20: 16:00:00 1 via ORAL
  Filled 2019-11-20: qty 1

## 2019-11-20 MED ORDER — HYDROCODONE-ACETAMINOPHEN 5-325 MG PO TABS: 1.0000 | ORAL_TABLET | Freq: Three times a day (TID) | ORAL | 0 refills | Status: DC | PRN

## 2019-11-20 NOTE — ED Provider Notes (Signed)
[facility] Emergency Department Provider Note ____________________________________________  Time seen: 1515  I have reviewed the triage vital signs and the nursing notes.  HISTORY  Chief Complaint  Back Pain (lower)   HPI Lee Sandoval is a 28 y.o. male presents to the ER today with c/o low back pain. He reports this started last night while at work. He was moving a metal rack and felt something in his back "pop/snap".  He immediately had difficulty ambulating.  He describes the pain as a burning sensation that radiates down the back of his legs and stops at his knees.  He reports associated numbness and tingling.  He denies loss of bowel and bladder control.  He has tried ice, Ibuprofen and resting with minimal relief.  He reports he has had a pinched nerve in the low back that affected his left leg in the past and this feels completely different.  Past Medical History:  Diagnosis Date  . Attention deficit hyperactivity disorder (ADHD), combined type, mild   . Generalized anxiety disorder     Patient Active Problem List   Diagnosis Date Noted  . Acute appendicitis with localized peritonitis 08/08/2019  . 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 Concussion without loss of consciousness 12/04/2015  . Intermittent palpitations 07/04/2015  . GAD (generalized anxiety disorder) 07/04/2015  . Cigarette smoker 06/12/2015  . 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 Clinical depression 08/08/2008  . Obsessive-compulsive disorder 08/08/2008    Past Surgical History:  Procedure Laterality Date  . LAPAROSCOPIC APPENDECTOMY N/A 08/09/2019   Procedure: APPENDECTOMY LAPAROSCOPIC;  Surgeon: Carolan Shiver, MD;  Location: [facility] ORS;  Service: General;  Laterality: N/A;  . TONSILLECTOMY    . TOOTH EXTRACTION      Prior to Admission medications   Medication Sig Start Date End  Date Taking? Authorizing Provider  cyclobenzaprine (FLEXERIL) 10 MG tablet Take 1 tablet (10 mg total) by mouth 3 (three) times daily as needed for muscle spasms. 11/20/19   Lorre Munroe, NP  HYDROcodone-acetaminophen (NORCO/VICODIN) 5-325 MG tablet Take 1 tablet by mouth every 8 (eight) hours as needed for moderate pain. 11/20/19 11/19/20  Lorre Munroe, NP  predniSONE (DELTASONE) 10 MG tablet Take 6 tabs day 1, 5 tabs day 2, 4 tabs day 3, 3 tabs day 4, 2 tabs day 5, 1 tab day 6 11/20/19   Lorre Munroe, NP    Allergies Sulfa antibiotics and Tramadol hcl  Family History  Problem Relation Age of Onset  . Heart failure Father        died at age 54  . Depression Mother   . Mental illness Mother     Social History Social History   Tobacco Use  . Smoking status: Former Smoker    Packs/day: 1.00    Types: Cigarettes  . Smokeless tobacco: Current User  Substance Use Topics  . Alcohol use: Yes    Alcohol/week: 0.0 standard drinks    Comment: occasionally   . Drug use: Yes    Types: Marijuana    Comment: last smoked sunday morning    Review of Systems  Constitutional: Negative for fever, chills or body aches. Cardiovascular: Negative for chest pain or chest tightness. Respiratory: Negative for cough or shortness of breath. Gastrointestinal: Negative for loss of bowel control. Genitourinary: Negative for loss of bladder control. Musculoskeletal: Positive for back pain and lower extremity weakness.  Negative for  back pain. Skin: Negative for rash. Neurological: Positive for numbness and tingling in lower extremities.  ____________________________________________  PHYSICAL EXAM:  VITAL SIGNS: ED Triage Vitals  Enc Vitals Group     BP 11/20/19 1439 133/80     Pulse Rate 11/20/19 1439 86     Resp 11/20/19 1439 16     Temp 11/20/19 1439 99.6 F (37.6 C)     Temp Source 11/20/19 1439 Oral     SpO2 11/20/19 1439 97 %     Weight 11/20/19 1440 200 lb (90.7 kg)     Height  11/20/19 1440 6\' 2"  (1.88 m)     Head Circumference --      Peak Flow --      Pain Score 11/20/19 1440 8     Pain Loc --      Pain Edu? --      Excl. in [place]? --     Constitutional: Alert and oriented.  Appears in pain. Head: Normocephalic and atraumatic. Cardiovascular: Normal rate, regular rhythm.  Radial pulses 2+ bilaterally Respiratory: Normal respiratory effort. No wheezes/rales/rhonchi. Musculoskeletal: Decreased flexion, extension and rotation of the spine secondary to pain.  Pain with palpation over the lumbar spine.  He is able to bear weight but unable to walk on heels or toes.  Strength 5/5 LLE, 4/5 RLE. Neurologic:  Normal speech and language. No gross focal neurologic deficits are appreciated. Skin:  Skin is warm, dry and intact. No rash noted.  ____________________________________________     RADIOLOGY   Imaging Orders     DG Lumbar Spine 2-3 Views IMPRESSION:  Normal alignment and no acute bony findings or degenerative changes.    ____________________________________________   INITIAL IMPRESSION / ASSESSMENT AND PLAN / ED COURSE  Acute Low Back:  Xray lumbar negative for acute findings Decadron 10 mg IM x 1 Flexeril 10 mg PO x 1 Vicodin 5-325 mg PO x 1 Encouraged stretching, ice and massage RX for Prednisone x 6 days RX for Flexeril 10 mg PO TID prn- sedation caution given RX for Vicodin 5-325 mg TID prn- sedation caution given Work note provided Recommend follow up with PCP and PT     I reviewed the patient's prescription history over the last 12 months in the multi-state controlled substances database(s) that includes [place], [place], [place], [place], [place], [place], [place], [place], [place], [place], [place], [place], [place], and [place].  Results were notable for Hydrocodone-Acetaminophen 5-325, #30 08/09/19. ____________________________________________  FINAL CLINICAL IMPRESSION(S) / ED  DIAGNOSES  Final diagnoses:  Acute midline low back pain with bilateral sciatica   Webb Silversmith, NP    Jearld Fenton, NP 11/20/19 1654    Arta Silence, MD 11/21/19 0025

## 2019-11-20 NOTE — Discharge Instructions (Signed)
You were seen today for acute low back pain with bilateral sciatica. Your xray was negative. I have given you prescriptions for antiinflammatories, muscle relaxers and pain medications. These may cause sedation. Avoid other NSAID's OTC (Iburofen, Aleve, Motrin, Advil). Follow up with your PCP and get a referral for Physical Therapy. Ice and stretching should be helpful.

## 2019-11-20 NOTE — ED Triage Notes (Signed)
PT c/o lower back pain and was at work moving a metal rack and felt his lower back pop/snap. Pt would like to file workmans comp

## 2019-11-20 NOTE — ED Notes (Signed)
Pt verbalized understanding of discharge instructions. NAD at this time. 

## 2019-11-20 NOTE — ED Notes (Signed)
Pt to the er for back pain. Pt felt a pop and a drop in his back last night while at work.Pt was lifting a crate and bending over to put the case down.  Pt reports pain down both legs and knees going out. Pt told his manager and collapsed to his knees. Pt felt unable to stand up. Pt had difficulty walking out of work last night. Pt used ice last night and took IBU at home. Pt called back to hotline at work which instructed to go to urgent care. Urgent care told him that he needed to come here for MRI. Pt works for [employer].

## 2020-05-17 ENCOUNTER — Other Ambulatory Visit: Payer: Self-pay

## 2020-05-17 ENCOUNTER — Emergency Department
Admission: EM | Admit: 2020-05-17 | Discharge: 2020-05-17 | Disposition: A | Discharge status: Home / Self Care | Payer: No Typology Code available for payment source | Attending: Emergency Medicine | Admitting: Emergency Medicine

## 2020-05-17 ENCOUNTER — Emergency Department: Payer: No Typology Code available for payment source

## 2020-05-17 DIAGNOSIS — F17290 Nicotine dependence, other tobacco product, uncomplicated: Secondary | ICD-10-CM | POA: Diagnosis not present

## 2020-05-17 DIAGNOSIS — R079 Chest pain, unspecified: Secondary | ICD-10-CM

## 2020-05-17 DIAGNOSIS — R0789 Other chest pain: Secondary | ICD-10-CM | POA: Insufficient documentation

## 2020-05-17 DIAGNOSIS — H539 Unspecified visual disturbance: Secondary | ICD-10-CM

## 2020-05-17 DIAGNOSIS — H538 Other visual disturbances: Secondary | ICD-10-CM | POA: Diagnosis not present

## 2020-05-17 DIAGNOSIS — R110 Nausea: Secondary | ICD-10-CM | POA: Insufficient documentation

## 2020-05-17 LAB — TROPONIN I (HIGH SENSITIVITY)
Troponin I (High Sensitivity): 2 ng/L (ref <18)
Troponin I (High Sensitivity): 3 ng/L (ref <18)

## 2020-05-17 LAB — CBC
HCT: 43.9 % (ref 39.0–52.0)
Hemoglobin: 15.1 g/dL (ref 13.0–17.0)
MCH: 29.9 pg (ref 26.0–34.0)
MCHC: 34.4 g/dL (ref 30.0–36.0)
MCV: 86.9 fL (ref 80.0–100.0)
Platelets: 270 10*3/uL (ref 150–400)
RBC: 5.05 MIL/uL (ref 4.22–5.81)
RDW: 12.2 % (ref 11.5–15.5)
WBC: 8.6 10*3/uL (ref 4.0–10.5)
nRBC: 0 % (ref 0.0–0.2)

## 2020-05-17 LAB — BASIC METABOLIC PANEL
Anion gap: 9 (ref 5–15)
BUN: 10 mg/dL (ref 6–20)
CO2: 27 mmol/L (ref 22–32)
Calcium: 9.2 mg/dL (ref 8.9–10.3)
Chloride: 102 mmol/L (ref 98–111)
Creatinine, Ser: 0.91 mg/dL (ref 0.61–1.24)
GFR calc Af Amer: >60 mL/min (ref >60)
GFR calc non Af Amer: >60 mL/min (ref >60)
Glucose, Bld: 115 mg/dL — ABNORMAL HIGH (ref 70–99)
Potassium: 3.6 mmol/L (ref 3.5–5.1)
Sodium: 138 mmol/L (ref 135–145)

## 2020-05-17 MED ORDER — SODIUM CHLORIDE 0.9% FLUSH: 3.0000 mL | Freq: Once | INTRAVENOUS | Status: DC

## 2020-05-17 MED ORDER — LIDOCAINE VISCOUS HCL 2 % MT SOLN
15.0000 mL | Freq: Once | OROMUCOSAL | Status: AC
  Administered 2020-05-17: 15 mL via OROMUCOSAL
  Filled 2020-05-17: qty 15

## 2020-05-17 NOTE — Discharge Instructions (Signed)
Please seek medical attention for any high fevers, chest pain, shortness of breath, change in behavior, persistent vomiting, bloody stool or any other new or concerning symptoms.  

## 2020-05-17 NOTE — ED Provider Notes (Signed)
[facility] Emergency Department Provider Note   ____________________________________________   I have reviewed the triage vital signs and the nursing notes.   HISTORY  Chief Complaint Chest Pain   History limited by: Not Limited   HPI Lee Sandoval is a 29 y.o. male who presents to the emergency department today because of concern of episode of chest pain.  Patient states that he woke up this morning around 1 AM and felt like his vision was not working.  He states everything was black.  When he finally could focus on some light he felt like it was drifting away.  He then developed chest pain.  States it is located in the mid chest.  He has some radiation into his left arm.  This is been accompanied by the sense of being out of it and somewhat inability to focus.  Patient states he had to wait for his significant other to be done with work before he could present to the emergency department.  He denies similar symptoms in the past.  Records reviewed. Per medical record review patient has a history of OCD, anxiety.  Past Medical History:  Diagnosis Date  . Attention deficit hyperactivity disorder (ADHD), combined type, mild   . Generalized anxiety disorder     Patient Active Problem List   Diagnosis Date Noted  . Acute appendicitis with localized peritonitis 08/08/2019  . 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 Concussion without loss of consciousness 12/04/2015  . Intermittent palpitations 07/04/2015  . GAD (generalized anxiety disorder) 07/04/2015  . Cigarette smoker 06/12/2015  . 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 Clinical depression 08/08/2008  . Obsessive-compulsive disorder 08/08/2008    Past Surgical History:  Procedure Laterality Date  . LAPAROSCOPIC APPENDECTOMY N/A 08/09/2019   Procedure: APPENDECTOMY LAPAROSCOPIC;  Surgeon: Carolan Shiver,  MD;  Location: [facility] ORS;  Service: General;  Laterality: N/A;  . TONSILLECTOMY    . TOOTH EXTRACTION      Prior to Admission medications   Medication Sig Start Date End Date Taking? Authorizing Provider  cyclobenzaprine (FLEXERIL) 10 MG tablet Take 1 tablet (10 mg total) by mouth 3 (three) times daily as needed for muscle spasms. 11/20/19   Lorre Munroe, NP  HYDROcodone-acetaminophen (NORCO/VICODIN) 5-325 MG tablet Take 1 tablet by mouth every 8 (eight) hours as needed for moderate pain. 11/20/19 11/19/20  Lorre Munroe, NP  predniSONE (DELTASONE) 10 MG tablet Take 6 tabs day 1, 5 tabs day 2, 4 tabs day 3, 3 tabs day 4, 2 tabs day 5, 1 tab day 6 11/20/19   Lorre Munroe, NP    Allergies Sulfa antibiotics and Tramadol hcl  Family History  Problem Relation Age of Onset  . Heart failure Father        died at age 28  . Depression Mother   . Mental illness Mother     Social History Social History   Tobacco Use  . Smoking status: Former Smoker    Packs/day: 1.00    Types: Cigarettes  . Smokeless tobacco: Current User  Substance Use Topics  . Alcohol use: Yes    Alcohol/week: 0.0 standard drinks    Comment: occasionally   . Drug use: Not Currently    Types: Marijuana    Comment: last smoked sunday morning    Review of Systems Constitutional: No fever/chills Eyes: Positive for visual change. ENT: No sore throat. Cardiovascular:  Positive for chest pain. Respiratory: Denies shortness of breath. Gastrointestinal: No abdominal pain.  No nausea, no vomiting.  No diarrhea.   Genitourinary: Negative for dysuria. Musculoskeletal: Positive for back pain.  Skin: Negative for rash. Neurological: Negative for headaches, focal weakness or numbness.  ____________________________________________   PHYSICAL EXAM:  VITAL SIGNS: ED Triage Vitals  Enc Vitals Group     BP 05/17/20 1829 137/85     Pulse Rate 05/17/20 1829 98     Resp 05/17/20 1829 17     Temp 05/17/20 1829 98 F  (36.7 C)     Temp src --      SpO2 05/17/20 1829 99 %     Weight 05/17/20 1830 208 lb (94.3 kg)     Height 05/17/20 1830 6\' 2"  (1.88 m)     Head Circumference --      Peak Flow --      Pain Score 05/17/20 1830 6   Constitutional: Alert and oriented.  Eyes: Conjunctivae are normal.  ENT      Head: Normocephalic and atraumatic.      Nose: No congestion/rhinnorhea.      Mouth/Throat: Mucous membranes are moist.      Neck: No stridor. Hematological/Lymphatic/Immunilogical: No cervical lymphadenopathy. Cardiovascular: Normal rate, regular rhythm.  No murmurs, rubs, or gallops.  Respiratory: Normal respiratory effort without tachypnea nor retractions. Breath sounds are clear and equal bilaterally. No wheezes/rales/rhonchi. Gastrointestinal: Soft and non tender. No rebound. No guarding.  Genitourinary: Deferred Musculoskeletal: Normal range of motion in all extremities. No lower extremity edema. Neurologic:  Normal speech and language. No gross focal neurologic deficits are appreciated.  Skin:  Skin is warm, dry and intact. No rash noted. Psychiatric: Mood and affect are normal. Speech and behavior are normal. Patient exhibits appropriate insight and judgment.  ____________________________________________    LABS (pertinent positives/negatives)  Trop hs 2 CBC wbc 8.6, hgb 15.1, plt 270 BMP wnl except glu 115  ____________________________________________   EKG  I, Nance Pear, attending physician, personally viewed and interpreted this EKG  EKG Time: 1838 Rate: 88 Rhythm: normal sinus rhythm Axis: normal Intervals: qtc 384 QRS: narrow ST changes: no st elevation Impression: right atrial enlargement, otherwise normal ekg   ____________________________________________    RADIOLOGY  CXR No acute cardiopulmonary disease  CT head No acute  abnormality ____________________________________________   PROCEDURES  Procedures  ____________________________________________   INITIAL IMPRESSION / ASSESSMENT AND PLAN / ED COURSE  Pertinent labs & imaging results that were available during my care of the patient were reviewed by me and considered in my medical decision making (see chart for details).   Patient presented to the emergency department today because of concerns for chest pain that had been accompanied by some visual changes.  Time my exam patient still has some chest discomfort still feels somewhat out of it.  Blood work here without concerning findings.  Did obtain a head CT which was negative.  At this point unclear etiology of the patient's symptoms however have low suspicion for acute intracranial process or cardiac etiology.  Given the patient does feel improvement I think it is reasonable for patient to be discharged home.  Discussed with patient portance of follow-up.  ____________________________________________   FINAL CLINICAL IMPRESSION(S) / ED DIAGNOSES  Final diagnoses:  Nonspecific chest pain  Vision changes     Note: This dictation was prepared with Dragon dictation. Any transcriptional errors that result from this process are unintentional     Nance Pear, MD 05/17/20 [phone]

## 2020-05-17 NOTE — ED Triage Notes (Addendum)
Pt comes via POV from home with c/o chest pain that started early this am. Pt states he woke up around 1am and felt he couldn't focus and was disoriented.  Pt states he felt since he has been out of it.  Pt states mid upper chest pain. Pt states some radiation to back of shoulder blades. Pt states some SOB

## 2020-05-17 NOTE — ED Notes (Signed)
Pt reports having a hx of acid reflux. He states similar sx of acid reflux to the episode of CP he had today. Pt reports nausea currently.

## 2020-05-17 NOTE — ED Notes (Signed)
Pt to CT at this time.

## 2020-09-04 ENCOUNTER — Other Ambulatory Visit: Payer: Self-pay

## 2020-09-04 ENCOUNTER — Emergency Department
Admission: EM | Admit: 2020-09-04 | Discharge: 2020-09-04 | Disposition: A | Discharge status: Home / Self Care | Payer: No Typology Code available for payment source | Attending: Emergency Medicine | Admitting: Emergency Medicine

## 2020-09-04 ENCOUNTER — Encounter: Payer: Self-pay | Admitting: Emergency Medicine

## 2020-09-04 ENCOUNTER — Emergency Department: Payer: No Typology Code available for payment source

## 2020-09-04 DIAGNOSIS — S199XXA Unspecified injury of neck, initial encounter: Secondary | ICD-10-CM | POA: Diagnosis present

## 2020-09-04 DIAGNOSIS — S161XXA Strain of muscle, fascia and tendon at neck level, initial encounter: Secondary | ICD-10-CM

## 2020-09-04 DIAGNOSIS — Z87891 Personal history of nicotine dependence: Secondary | ICD-10-CM | POA: Insufficient documentation

## 2020-09-04 DIAGNOSIS — X58XXXA Exposure to other specified factors, initial encounter: Secondary | ICD-10-CM | POA: Insufficient documentation

## 2020-09-04 DIAGNOSIS — R002 Palpitations: Secondary | ICD-10-CM

## 2020-09-04 LAB — BASIC METABOLIC PANEL
Anion gap: 10 (ref 5–15)
BUN: 10 mg/dL (ref 6–20)
CO2: 29 mmol/L (ref 22–32)
Calcium: 9.5 mg/dL (ref 8.9–10.3)
Chloride: 101 mmol/L (ref 98–111)
Creatinine, Ser: 0.81 mg/dL (ref 0.61–1.24)
GFR calc Af Amer: >60 mL/min (ref >60)
GFR calc non Af Amer: >60 mL/min (ref >60)
Glucose, Bld: 103 mg/dL — ABNORMAL HIGH (ref 70–99)
Potassium: 4.2 mmol/L (ref 3.5–5.1)
Sodium: 140 mmol/L (ref 135–145)

## 2020-09-04 LAB — CBC
HCT: 45.7 % (ref 39.0–52.0)
Hemoglobin: 16.3 g/dL (ref 13.0–17.0)
MCH: 29.9 pg (ref 26.0–34.0)
MCHC: 35.7 g/dL (ref 30.0–36.0)
MCV: 83.7 fL (ref 80.0–100.0)
Platelets: 287 10*3/uL (ref 150–400)
RBC: 5.46 MIL/uL (ref 4.22–5.81)
RDW: 11.9 % (ref 11.5–15.5)
WBC: 7.5 10*3/uL (ref 4.0–10.5)
nRBC: 0 % (ref 0.0–0.2)

## 2020-09-04 LAB — TROPONIN I (HIGH SENSITIVITY): Troponin I (High Sensitivity): 3 ng/L (ref <18)

## 2020-09-04 MED ORDER — BUTALBITAL-APAP-CAFFEINE 50-325-40 MG PO TABS
1.0000 | ORAL_TABLET | Freq: Four times a day (QID) | ORAL | 0 refills | Status: DC | PRN
Start: 2020-09-04 — End: 2021-04-19

## 2020-09-04 MED ORDER — BUTALBITAL-APAP-CAFFEINE 50-325-40 MG PO TABS
2.0000 | ORAL_TABLET | Freq: Once | ORAL | Status: AC
  Administered 2020-09-04: 2 via ORAL
  Filled 2020-09-04: qty 2

## 2020-09-04 NOTE — ED Notes (Signed)
Pt states coming in for pain that started in the neck last night and radiates to the head and right shoulder. Pt states he has also had some palpitations, sweating and episodes of disorientation at times. Pt placed on cardiac, bp and pulse ox monitor.

## 2020-09-04 NOTE — ED Triage Notes (Addendum)
Pt arrived via POV with c/o right neck pain and palpitations that started last night. Pt states he took ibuprofen with no relief of the neck pain.  Pt reports mild chest pain with shortness of breath as well.  Pt also reports some fatigue as well.   Pt has hx of anxiety, pt not currently not taking anything for anxiety.

## 2020-09-04 NOTE — ED Provider Notes (Addendum)
[facility] Emergency Department Provider Note  Time seen: 8:36 PM  I have reviewed the triage vital signs and the nursing notes.   HISTORY  Chief Complaint Palpitations and Neck Pain   HPI Lee Sandoval is a 29 y.o. male with a past medical history of ADD, anxiety, presents to the emergency department for neck pain chest pain.  According to the patient yesterday after waking from sleep he developed some neck pain on the right side of his neck and states today it had progressed into his chest and somewhat up into his head with a mild headache.  Patient denies any fever.  Patient was concerned because he went on the Internet and was worried what it could be.  No ear pain, no sore throat, no fever cough or congestion.  Patient is vaccinated against Covid.   Patient states he was feeling palpitations at times overnight last night but none currently.  No chest pain currently.  Past Medical History:  Diagnosis Date  . Attention deficit hyperactivity disorder (ADHD), combined type, mild   . Generalized anxiety disorder     Patient Active Problem List   Diagnosis Date Noted  . Acute appendicitis with localized peritonitis 08/08/2019  . 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 Concussion without loss of consciousness 12/04/2015  . Intermittent palpitations 07/04/2015  . GAD (generalized anxiety disorder) 07/04/2015  . Cigarette smoker 06/12/2015  . 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 Clinical depression 08/08/2008  . Obsessive-compulsive disorder 08/08/2008    Past Surgical History:  Procedure Laterality Date  . LAPAROSCOPIC APPENDECTOMY N/A 08/09/2019   Procedure: APPENDECTOMY LAPAROSCOPIC;  Surgeon: Carolan Shiver, MD;  Location: [facility] ORS;  Service: General;  Laterality: N/A;  . TONSILLECTOMY    . TOOTH EXTRACTION      Prior to Admission medications    Medication Sig Start Date End Date Taking? Authorizing Provider  cyclobenzaprine (FLEXERIL) 10 MG tablet Take 1 tablet (10 mg total) by mouth 3 (three) times daily as needed for muscle spasms. 11/20/19   Lorre Munroe, NP  HYDROcodone-acetaminophen (NORCO/VICODIN) 5-325 MG tablet Take 1 tablet by mouth every 8 (eight) hours as needed for moderate pain. 11/20/19 11/19/20  Lorre Munroe, NP  predniSONE (DELTASONE) 10 MG tablet Take 6 tabs day 1, 5 tabs day 2, 4 tabs day 3, 3 tabs day 4, 2 tabs day 5, 1 tab day 6 11/20/19   Lorre Munroe, NP    Allergies  Allergen Reactions  . Sulfa Antibiotics Hives    High fever.   . Tramadol Hcl Other (See Comments)    Reaction: unknown    Family History  Problem Relation Age of Onset  . Heart failure Father        died at age 66  . Depression Mother   . Mental illness Mother     Social History Social History   Tobacco Use  . Smoking status: Former Smoker    Packs/day: 1.00    Types: Cigarettes  . Smokeless tobacco: Current User  Vaping Use  . Vaping Use: Some days  Substance Use Topics  . Alcohol use: Yes    Alcohol/week: 0.0 standard drinks    Comment: occasionally   . Drug use: Not Currently    Types: Marijuana    Comment: last smoked sunday morning    Review of Systems Constitutional: Negative for fever. Eyes: Negative for visual complaints ENT:  Negative for recent illness/congestion Cardiovascular: Negative for chest pain. Respiratory: Negative for shortness of breath. Gastrointestinal: Negative for abdominal pain, vomiting and diarrhea. Genitourinary: Negative for urinary compaints Musculoskeletal: Negative for musculoskeletal complaints Skin: Negative for skin complaints  Neurological: Negative for headache All other ROS negative  ____________________________________________   PHYSICAL EXAM:  VITAL SIGNS: ED Triage Vitals  Enc Vitals Group     BP 09/04/20 1858 (!) 146/86     Pulse Rate 09/04/20 1858 96      Resp 09/04/20 1858 18     Temp 09/04/20 1858 99.1 F (37.3 C)     Temp Source 09/04/20 1858 Oral     SpO2 09/04/20 1858 95 %     Weight 09/04/20 1858 208 lb (94.3 kg)     Height 09/04/20 1858 6\' 2"  (1.88 m)     Head Circumference --      Peak Flow --      Pain Score 09/04/20 1902 7     Pain Loc --      Pain Edu? --      Excl. in GC? --     Constitutional: Alert and oriented.  Somewhat anxious in appearance. Eyes: Normal exam ENT      Head: Normocephalic and atraumatic.      Mouth/Throat: Mucous membranes are moist. Cardiovascular: Normal rate, regular rhythm. No murmur Respiratory: Normal respiratory effort without tachypnea nor retractions. Breath sounds are clear Gastrointestinal: Soft and nontender. No distention.   Musculoskeletal: Nontender with normal range of motion in all extremities.  Neurologic:  Normal speech and language. No gross focal neurologic deficits  Skin:  Skin is warm, dry and intact.  Psychiatric: Mood and affect are normal.   ____________________________________________    EKG  EKG viewed and interpreted by myself shows normal sinus rhythm at 92 bpm with a narrow QRS, normal axis, normal intervals, no concerning ST changes.  ____________________________________________    RADIOLOGY  Chest x-ray read as negative I have personally reviewed the patient's chest x-ray images.  No acute findings on my examination and agree with radiology read.  ____________________________________________   INITIAL IMPRESSION / ASSESSMENT AND PLAN / ED COURSE  Pertinent labs & imaging results that were available during my care of the patient were reviewed by me and considered in my medical decision making (see chart for details).   Patient presents to the emergency department for palpitations and neck pain ongoing since yesterday.  Patient states the neck pain appear to go into the chest that sometimes and occasionally into the head causing a mild headache.  Overall  the patient appears extremely well, reassuring physical exam does have mild right-sided paracervical tenderness palpation consistent with likely cervical strain.  Normal right-sided tympanic membrane.  Discussed with the patient a trial of a headache medication such as Fioricet as he states he has tried that in the past with good success.  Also discussed supportive care at home including heating pad and rest.  Discussed return precautions.  Patient agreeable to plan of care. ED work-up is reassuring including labs, cardiac enzymes, chest x-ray and EKG.  Lee Sandoval was evaluated in Emergency Department on 09/04/2020 for the symptoms described in the history of present illness. He was evaluated in the context of the global COVID-19 pandemic, which necessitated consideration that the patient might be at risk for infection with the SARS-CoV-2 virus that causes COVID-19. Institutional protocols and algorithms that pertain to the evaluation of patients at risk for COVID-19 are in a state of  rapid change based on information released by regulatory bodies including the CDC and federal and state organizations. These policies and algorithms were followed during the patient's care in the ED.  ____________________________________________   FINAL CLINICAL IMPRESSION(S) / ED DIAGNOSES  Cervical strain   Minna Antis, MD 09/04/20 2951    Minna Antis, MD 09/04/20 2042

## 2020-09-21 ENCOUNTER — Emergency Department
Admission: EM | Admit: 2020-09-21 | Discharge: 2020-09-21 | Disposition: A | Discharge status: Home / Self Care | Payer: No Typology Code available for payment source | Attending: Emergency Medicine | Admitting: Emergency Medicine

## 2020-09-21 ENCOUNTER — Emergency Department: Payer: No Typology Code available for payment source

## 2020-09-21 ENCOUNTER — Other Ambulatory Visit: Payer: Self-pay

## 2020-09-21 ENCOUNTER — Encounter: Payer: Self-pay | Admitting: Emergency Medicine

## 2020-09-21 DIAGNOSIS — Z20822 Contact with and (suspected) exposure to covid-19: Secondary | ICD-10-CM | POA: Diagnosis not present

## 2020-09-21 DIAGNOSIS — Z87891 Personal history of nicotine dependence: Secondary | ICD-10-CM | POA: Diagnosis not present

## 2020-09-21 DIAGNOSIS — K209 Esophagitis, unspecified without bleeding: Secondary | ICD-10-CM | POA: Diagnosis not present

## 2020-09-21 DIAGNOSIS — R072 Precordial pain: Secondary | ICD-10-CM | POA: Diagnosis present

## 2020-09-21 DIAGNOSIS — R0789 Other chest pain: Secondary | ICD-10-CM

## 2020-09-21 LAB — COMPREHENSIVE METABOLIC PANEL
ALT: 99 U/L — ABNORMAL HIGH (ref 0–44)
AST: 50 U/L — ABNORMAL HIGH (ref 15–41)
Albumin: 4.4 g/dL (ref 3.5–5.0)
Alkaline Phosphatase: 63 U/L (ref 38–126)
Anion gap: 9 (ref 5–15)
BUN: 11 mg/dL (ref 6–20)
CO2: 26 mmol/L (ref 22–32)
Calcium: 9.5 mg/dL (ref 8.9–10.3)
Chloride: 102 mmol/L (ref 98–111)
Creatinine, Ser: 0.74 mg/dL (ref 0.61–1.24)
GFR, Estimated: >60 mL/min (ref >60)
Glucose, Bld: 94 mg/dL (ref 70–99)
Potassium: 4.1 mmol/L (ref 3.5–5.1)
Sodium: 137 mmol/L (ref 135–145)
Total Bilirubin: 0.7 mg/dL (ref 0.3–1.2)
Total Protein: 8 g/dL (ref 6.5–8.1)

## 2020-09-21 LAB — CBC WITH DIFFERENTIAL/PLATELET
Abs Immature Granulocytes: 0.01 10*3/uL (ref 0.00–0.07)
Basophils Absolute: 0 10*3/uL (ref 0.0–0.1)
Basophils Relative: 0 %
Eosinophils Absolute: 0 10*3/uL (ref 0.0–0.5)
Eosinophils Relative: 1 %
HCT: 45.8 % (ref 39.0–52.0)
Hemoglobin: 15.7 g/dL (ref 13.0–17.0)
Immature Granulocytes: 0 %
Lymphocytes Relative: 37 %
Lymphs Abs: 1.7 10*3/uL (ref 0.7–4.0)
MCH: 29.1 pg (ref 26.0–34.0)
MCHC: 34.3 g/dL (ref 30.0–36.0)
MCV: 85 fL (ref 80.0–100.0)
Monocytes Absolute: 0.4 10*3/uL (ref 0.1–1.0)
Monocytes Relative: 8 %
Neutro Abs: 2.4 10*3/uL (ref 1.7–7.7)
Neutrophils Relative %: 54 %
Platelets: 265 10*3/uL (ref 150–400)
RBC: 5.39 MIL/uL (ref 4.22–5.81)
RDW: 12.5 % (ref 11.5–15.5)
WBC: 4.6 10*3/uL (ref 4.0–10.5)
nRBC: 0 % (ref 0.0–0.2)

## 2020-09-21 LAB — RESPIRATORY PANEL BY RT PCR (FLU A&B, COVID)
Influenza A by PCR: NEGATIVE
Influenza B by PCR: NEGATIVE
SARS Coronavirus 2 by RT PCR: NEGATIVE

## 2020-09-21 LAB — FIBRIN DERIVATIVES D-DIMER (ARMC ONLY): Fibrin derivatives D-dimer (ARMC): 483 ng/mL (FEU) (ref 0.00–499.00)

## 2020-09-21 LAB — TROPONIN I (HIGH SENSITIVITY)
Troponin I (High Sensitivity): 2 ng/L (ref <18)
Troponin I (High Sensitivity): 3 ng/L (ref <18)

## 2020-09-21 MED ORDER — PANTOPRAZOLE SODIUM 40 MG PO TBEC: 40.0000 mg | DELAYED_RELEASE_TABLET | Freq: Every day | ORAL | 1 refills | Status: DC

## 2020-09-21 MED ORDER — ONDANSETRON HCL 4 MG/2ML IJ SOLN
4.0000 mg | Freq: Once | INTRAMUSCULAR | Status: AC
  Administered 2020-09-21: 4 mg via INTRAVENOUS
  Filled 2020-09-21: qty 2

## 2020-09-21 MED ORDER — ALUM & MAG HYDROXIDE-SIMETH 200-200-20 MG/5ML PO SUSP
30.0000 mL | Freq: Once | ORAL | Status: AC
  Administered 2020-09-21: 30 mL via ORAL
  Filled 2020-09-21: qty 30

## 2020-09-21 MED ORDER — HYDROCODONE-ACETAMINOPHEN 5-325 MG PO TABS: 1.0000 | ORAL_TABLET | Freq: Four times a day (QID) | ORAL | 0 refills | Status: DC | PRN

## 2020-09-21 MED ORDER — MORPHINE SULFATE (PF) 4 MG/ML IV SOLN
4.0000 mg | Freq: Once | INTRAVENOUS | Status: AC
  Administered 2020-09-21: 4 mg via INTRAVENOUS
  Filled 2020-09-21: qty 1

## 2020-09-21 MED ORDER — PREDNISONE 10 MG PO TABS: 40.0000 mg | ORAL_TABLET | Freq: Every day | ORAL | 0 refills | Status: DC

## 2020-09-21 MED ORDER — LIDOCAINE VISCOUS HCL 2 % MT SOLN
15.0000 mL | Freq: Once | OROMUCOSAL | Status: AC
  Administered 2020-09-21: 15 mL via ORAL
  Filled 2020-09-21: qty 15

## 2020-09-21 NOTE — ED Notes (Signed)
EKG completed. Pt in with CP that started upon awaking this morning. States it is a dull kind of pain on the left side.

## 2020-09-21 NOTE — Discharge Instructions (Addendum)
Follow-up with a GI specialist.  Please call for an appointment.  Return emergency department worsening.  Take medications as prescribed.

## 2020-09-21 NOTE — ED Provider Notes (Signed)
[facility] Emergency Department Provider Note  ____________________________________________   First MD Initiated Contact with Patient 09/21/20 1324     (approximate)  I have reviewed the triage vital signs and the nursing notes.   HISTORY  Chief Complaint Shortness of Breath    HPI Lee Sandoval is a 29 y.o. male presents emergency department complaining of left-sided chest pain.  States he woke up hurting underneath the left axilla and it spreads across the chest to the sternum.  States he got a little nauseated and little sweaty when it happened.  He denies any fever or chills.  Patient had a recent trip to [facility].  Was in the car for long period time and his feet were swollen when he returned.  Patient is vaccinated for Covid with 2 doses of Moderna.   Patient states family history of CAD.  His father started having health/heart problems at age 69   Past Medical History:  Diagnosis Date  . Attention deficit hyperactivity disorder (ADHD), combined type, mild   . Generalized anxiety disorder     Patient Active Problem List   Diagnosis Date Noted  . Acute appendicitis with localized peritonitis 08/08/2019  . 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 Concussion without loss of consciousness 12/04/2015  . Intermittent palpitations 07/04/2015  . GAD (generalized anxiety disorder) 07/04/2015  . Cigarette smoker 06/12/2015  . 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 Clinical depression 08/08/2008  . Obsessive-compulsive disorder 08/08/2008    Past Surgical History:  Procedure Laterality Date  . LAPAROSCOPIC APPENDECTOMY N/A 08/09/2019   Procedure: APPENDECTOMY LAPAROSCOPIC;  Surgeon: Carolan Shiver, MD;  Location: [facility] ORS;  Service: General;  Laterality: N/A;  . TONSILLECTOMY    . TOOTH EXTRACTION      Prior to Admission medications   Medication  Sig Start Date End Date Taking? Authorizing Provider  butalbital-acetaminophen-caffeine (FIORICET) 50-325-40 MG tablet Take 1-2 tablets by mouth every 6 (six) hours as needed for headache. 09/04/20 09/04/21  Minna Antis, MD  HYDROcodone-acetaminophen (NORCO/VICODIN) 5-325 MG tablet Take 1 tablet by mouth every 6 (six) hours as needed for moderate pain. 09/21/20   Gale Klar, Roselyn Bering, PA-C  pantoprazole (PROTONIX) 40 MG tablet Take 1 tablet (40 mg total) by mouth daily. 09/21/20 09/21/21  Adin Laker, Roselyn Bering, PA-C  predniSONE (DELTASONE) 10 MG tablet Take 4 tablets (40 mg total) by mouth daily. 09/21/20   Zareena Willis, Roselyn Bering, PA-C    Allergies Sulfa antibiotics and Tramadol hcl  Family History  Problem Relation Age of Onset  . Heart failure Father        died at age 52  . Depression Mother   . Mental illness Mother     Social History Social History   Tobacco Use  . Smoking status: Former Smoker    Packs/day: 1.00    Types: Cigarettes  . Smokeless tobacco: Current User  Vaping Use  . Vaping Use: Some days  Substance Use Topics  . Alcohol use: Yes    Alcohol/week: 0.0 standard drinks    Comment: occasionally   . Drug use: Not Currently    Types: Marijuana    Comment: last smoked sunday morning    Review of Systems  Constitutional: No fever/chills Eyes: No visual changes. ENT: No sore throat. Respiratory: Denies cough Cardiovascular: Positive chest pain Gastrointestinal: Denies abdominal pain Genitourinary: Negative for dysuria. Musculoskeletal: Negative for back pain. Skin: Negative for rash.  Psychiatric: no mood changes,     ____________________________________________   PHYSICAL EXAM:  VITAL SIGNS: ED Triage Vitals  Enc Vitals Group     BP 09/21/20 1312 126/88     Pulse Rate 09/21/20 1312 76     Resp 09/21/20 1312 14     Temp 09/21/20 1312 98.4 F (36.9 C)     Temp Source 09/21/20 1312 Oral     SpO2 09/21/20 1312 97 %     Weight --      Height --      Head  Circumference --      Peak Flow --      Pain Score 09/21/20 1259 6     Pain Loc --      Pain Edu? --      Excl. in GC? --     Constitutional: Alert and oriented. Well appearing and in no acute distress. Eyes: Conjunctivae are normal.  Head: Atraumatic. Nose: No congestion/rhinnorhea. Mouth/Throat: Mucous membranes are moist.   Neck:  supple no lymphadenopathy noted Cardiovascular: Normal rate, regular rhythm. Heart sounds are normal Respiratory: Normal respiratory effort.  No retractions, lungs c t a  Abd: soft nontender bs normal all 4 quad GU: deferred Musculoskeletal: FROM all extremities, warm and well perfused Neurologic:  Normal speech and language.  Skin:  Skin is warm, dry and intact. No rash noted. Psychiatric: Mood and affect are normal. Speech and behavior are normal.  ____________________________________________   LABS (all labs ordered are listed, but only abnormal results are displayed)  Labs Reviewed  COMPREHENSIVE METABOLIC PANEL - Abnormal; Notable for the following components:      Result Value   AST 50 (*)    ALT 99 (*)    All other components within normal limits  RESPIRATORY PANEL BY RT PCR (FLU A&B, COVID)  CBC WITH DIFFERENTIAL/PLATELET  FIBRIN DERIVATIVES D-DIMER ([facility] ONLY)  TROPONIN I (HIGH SENSITIVITY)  TROPONIN I (HIGH SENSITIVITY)   ____________________________________________   ____________________________________________  RADIOLOGY  Chest x-ray is normal  ____________________________________________   PROCEDURES  Procedure(s) performed: GI cocktail   Procedures    ____________________________________________   INITIAL IMPRESSION / ASSESSMENT AND PLAN / ED COURSE  Pertinent labs & imaging results that were available during my care of the patient were reviewed by me and considered in my medical decision making (see chart for details).   Patient is 29 year old male presents emergency department for left-sided chest pain.   Family history of heart problems in which his father started having difficulty at age 33.  See HPI  Physical exam shows patient appear stable.  Exam is unremarkable.  DDx: MI, PE, chest wall pain, esophagitis, Covid  CBC is normal, comprehensive metabolic panel elevated AST and ALT, troponin is normal, D-dimer is 483 which is normal, and respiratory panel is negative  Chest x-ray is normal, EKG shows normal sinus rhythm  I did explain the findings to the patient.  I do not feel that he has a PE as his D-dimer is normal at he is not tachycardic or short of breath at this time.  I do not feel he had an MI as his troponin is normal and its been more than 6 hours since onset of symptoms.  I do feel he might have esophagitis as he did have relief with the GI cocktail.  Has a history of reflux and so feel he needs to follow-up with GI to address this issue.  Diagnosis was chest wall pain and esophagitis.  He is  to follow-up with his regular doctor as needed.  Follow-up with GI for evaluation.  Return emergency department if worsening.  He was given a prescription for Protonix, prednisone for 3 days and pain medication.  He was discharged in stable condition.     Saharsh Sterling was evaluated in Emergency Department on 09/21/2020 for the symptoms described in the history of present illness. He was evaluated in the context of the global COVID-19 pandemic, which necessitated consideration that the patient might be at risk for infection with the SARS-CoV-2 virus that causes COVID-19. Institutional protocols and algorithms that pertain to the evaluation of patients at risk for COVID-19 are in a state of rapid change based on information released by regulatory bodies including the CDC and federal and state organizations. These policies and algorithms were followed during the patient's care in the ED.    As part of my medical decision making, I reviewed the following data within the electronic MEDICAL RECORD NUMBER  Nursing notes reviewed and incorporated, Labs reviewed , EKG interpreted see physician read, Old chart reviewed, Radiograph reviewed , Notes from prior ED visits and [place] Controlled Substance Database  ____________________________________________   FINAL CLINICAL IMPRESSION(S) / ED DIAGNOSES  Final diagnoses:  Chest wall pain  Esophagitis      NEW MEDICATIONS STARTED DURING THIS VISIT:  New Prescriptions   HYDROCODONE-ACETAMINOPHEN (NORCO/VICODIN) 5-325 MG TABLET    Take 1 tablet by mouth every 6 (six) hours as needed for moderate pain.   PANTOPRAZOLE (PROTONIX) 40 MG TABLET    Take 1 tablet (40 mg total) by mouth daily.   PREDNISONE (DELTASONE) 10 MG TABLET    Take 4 tablets (40 mg total) by mouth daily.     Note:  This document was prepared using Dragon voice recognition software and may include unintentional dictation errors.    Faythe Ghee, PA-C 09/21/20 1711    Sharman Cheek, MD 09/26/20 1348

## 2020-09-21 NOTE — ED Triage Notes (Signed)
Says he has history of back pain, but last night he was up late and noticed pain on left side of rib cage--under arm that was sharp.  Then he noticed that he was short of breath.  Then his fingers got numb.

## 2020-12-28 ENCOUNTER — Other Ambulatory Visit: Payer: Self-pay

## 2020-12-28 ENCOUNTER — Emergency Department
Admission: EM | Admit: 2020-12-28 | Discharge: 2020-12-28 | Disposition: A | Discharge status: Home / Self Care | Payer: HRSA Program | Attending: Emergency Medicine | Admitting: Emergency Medicine

## 2020-12-28 ENCOUNTER — Ambulatory Visit: Payer: Self-pay | Admitting: *Deleted

## 2020-12-28 ENCOUNTER — Emergency Department: Payer: HRSA Program

## 2020-12-28 DIAGNOSIS — R030 Elevated blood-pressure reading, without diagnosis of hypertension: Secondary | ICD-10-CM | POA: Diagnosis not present

## 2020-12-28 DIAGNOSIS — R112 Nausea with vomiting, unspecified: Secondary | ICD-10-CM

## 2020-12-28 DIAGNOSIS — R1110 Vomiting, unspecified: Secondary | ICD-10-CM | POA: Insufficient documentation

## 2020-12-28 DIAGNOSIS — Z87891 Personal history of nicotine dependence: Secondary | ICD-10-CM | POA: Insufficient documentation

## 2020-12-28 DIAGNOSIS — R079 Chest pain, unspecified: Secondary | ICD-10-CM

## 2020-12-28 DIAGNOSIS — U071 COVID-19: Secondary | ICD-10-CM | POA: Diagnosis not present

## 2020-12-28 DIAGNOSIS — R630 Anorexia: Secondary | ICD-10-CM | POA: Insufficient documentation

## 2020-12-28 DIAGNOSIS — J40 Bronchitis, not specified as acute or chronic: Secondary | ICD-10-CM

## 2020-12-28 DIAGNOSIS — Z20822 Contact with and (suspected) exposure to covid-19: Secondary | ICD-10-CM

## 2020-12-28 DIAGNOSIS — R519 Headache, unspecified: Secondary | ICD-10-CM

## 2020-12-28 DIAGNOSIS — R059 Cough, unspecified: Secondary | ICD-10-CM | POA: Diagnosis present

## 2020-12-28 LAB — CBC
HCT: 46.4 % (ref 39.0–52.0)
Hemoglobin: 15.7 g/dL (ref 13.0–17.0)
MCH: 29.2 pg (ref 26.0–34.0)
MCHC: 33.8 g/dL (ref 30.0–36.0)
MCV: 86.2 fL (ref 80.0–100.0)
Platelets: 259 10*3/uL (ref 150–400)
RBC: 5.38 MIL/uL (ref 4.22–5.81)
RDW: 12.3 % (ref 11.5–15.5)
WBC: 9.3 10*3/uL (ref 4.0–10.5)
nRBC: 0 % (ref 0.0–0.2)

## 2020-12-28 LAB — BASIC METABOLIC PANEL
Anion gap: 12 (ref 5–15)
BUN: 8 mg/dL (ref 6–20)
CO2: 26 mmol/L (ref 22–32)
Calcium: 9.3 mg/dL (ref 8.9–10.3)
Chloride: 101 mmol/L (ref 98–111)
Creatinine, Ser: 0.8 mg/dL (ref 0.61–1.24)
GFR, Estimated: >60 mL/min (ref >60)
Glucose, Bld: 98 mg/dL (ref 70–99)
Potassium: 4.1 mmol/L (ref 3.5–5.1)
Sodium: 139 mmol/L (ref 135–145)

## 2020-12-28 LAB — TROPONIN I (HIGH SENSITIVITY): Troponin I (High Sensitivity): 3 ng/L (ref <18)

## 2020-12-28 MED ORDER — PANTOPRAZOLE SODIUM 40 MG PO TBEC: 40.0000 mg | DELAYED_RELEASE_TABLET | Freq: Every day | ORAL | 3 refills | Status: DC

## 2020-12-28 MED ORDER — ONDANSETRON 4 MG PO TBDP
4.0000 mg | ORAL_TABLET | Freq: Once | ORAL | Status: AC
  Administered 2020-12-28: 4 mg via ORAL
  Filled 2020-12-28: qty 1

## 2020-12-28 MED ORDER — ONDANSETRON HCL 4 MG PO TABS: 4.0000 mg | ORAL_TABLET | Freq: Three times a day (TID) | ORAL | 0 refills | Status: DC | PRN

## 2020-12-28 MED ORDER — ALUM & MAG HYDROXIDE-SIMETH 200-200-20 MG/5ML PO SUSP
30.0000 mL | Freq: Once | ORAL | Status: AC
  Administered 2020-12-28: 30 mL via ORAL
  Filled 2020-12-28: qty 30

## 2020-12-28 MED ORDER — KETOROLAC TROMETHAMINE 30 MG/ML IJ SOLN
30.0000 mg | Freq: Once | INTRAMUSCULAR | Status: AC
  Administered 2020-12-28: 30 mg via INTRAMUSCULAR
  Filled 2020-12-28: qty 1

## 2020-12-28 NOTE — ED Provider Notes (Signed)
[facility] Emergency Department Provider Note  ____________________________________________   Event Date/Time   First MD Initiated Contact with Patient 12/28/20 1342     (approximate)  I have reviewed the triage vital signs and the nursing notes.   HISTORY  Chief Complaint Generalized Body Aches and Chest Pain   HPI Lee Sandoval is a 30 y.o. male with a past medical history of GAD, ADHD, previous tobacco abuse now currently using nicotine vape presents for assessment approximately 2 days of cough, chest tightness, shortness of breath, headaches, myalgias, fevers, posttussive emesis, decreased appetite and general malaise.  No hemoptysis, eye pain, earache, diarrhea, urinary symptoms, rash or focal extremity pain.  No recent falls or injuries.  Patient was not drink alcohol daily or use any other medical drugs.  He has been exposed to people in his home who are positive for COVID.         Past Medical History:  Diagnosis Date  . Attention deficit hyperactivity disorder (ADHD), combined type, mild   . Generalized anxiety disorder     Patient Active Problem List   Diagnosis Date Noted  . Acute appendicitis with localized peritonitis 08/08/2019  . 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 Concussion without loss of consciousness 12/04/2015  . Intermittent palpitations 07/04/2015  . GAD (generalized anxiety disorder) 07/04/2015  . Cigarette smoker 06/12/2015  . 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 Clinical depression 08/08/2008  . Obsessive-compulsive disorder 08/08/2008    Past Surgical History:  Procedure Laterality Date  . LAPAROSCOPIC APPENDECTOMY N/A 08/09/2019   Procedure: APPENDECTOMY LAPAROSCOPIC;  Surgeon: Carolan Shiver, MD;  Location: [facility] ORS;  Service: General;  Laterality: N/A;  . TONSILLECTOMY    . TOOTH EXTRACTION      Prior to  Admission medications   Medication Sig Start Date End Date Taking? Authorizing Provider  butalbital-acetaminophen-caffeine (FIORICET) 50-325-40 MG tablet Take 1-2 tablets by mouth every 6 (six) hours as needed for headache. 09/04/20 09/04/21  Minna Antis, MD  HYDROcodone-acetaminophen (NORCO/VICODIN) 5-325 MG tablet Take 1 tablet by mouth every 6 (six) hours as needed for moderate pain. 09/21/20   Fisher, Roselyn Bering, PA-C  pantoprazole (PROTONIX) 40 MG tablet Take 1 tablet (40 mg total) by mouth daily. 09/21/20 09/21/21  Fisher, Roselyn Bering, PA-C  predniSONE (DELTASONE) 10 MG tablet Take 4 tablets (40 mg total) by mouth daily. 09/21/20   Fisher, Roselyn Bering, PA-C    Allergies Sulfa antibiotics and Tramadol hcl  Family History  Problem Relation Age of Onset  . Heart failure Father        died at age 4  . Depression Mother   . Mental illness Mother     Social History Social History   Tobacco Use  . Smoking status: Former Smoker    Packs/day: 1.00    Types: Cigarettes  . Smokeless tobacco: Current User  Vaping Use  . Vaping Use: Some days  Substance Use Topics  . Alcohol use: Yes    Alcohol/week: 0.0 standard drinks    Comment: occasionally   . Drug use: Not Currently    Types: Marijuana    Comment: last smoked sunday morning    Review of Systems  Review of Systems  Constitutional: Positive for chills, fever and malaise/fatigue.  HENT: Positive for congestion. Negative for sore throat.   Eyes: Negative for pain.  Respiratory: Positive for cough, sputum production and shortness of breath. Negative  for stridor.   Cardiovascular: Positive for chest pain.  Gastrointestinal: Positive for nausea and vomiting.  Genitourinary: Negative for dysuria.  Musculoskeletal: Positive for myalgias.  Skin: Negative for rash.  Neurological: Positive for headaches. Negative for seizures and loss of consciousness.  Psychiatric/Behavioral: Negative for suicidal ideas.  All other systems reviewed and  are negative.     ____________________________________________   PHYSICAL EXAM:  VITAL SIGNS: ED Triage Vitals [12/28/20 1307]  Enc Vitals Group     BP 132/81     Pulse Rate 90     Resp 18     Temp 99.1 F (37.3 C)     Temp Source Oral     SpO2 99 %     Weight 220 lb (99.8 kg)     Height 6\' 2"  (1.88 m)     Head Circumference      Peak Flow      Pain Score 5     Pain Loc      Pain Edu?      Excl. in GC?    Vitals:   12/28/20 1307  BP: 132/81  Pulse: 90  Resp: 18  Temp: 99.1 F (37.3 C)  SpO2: 99%   Physical Exam Vitals and nursing note reviewed.  Constitutional:      Appearance: He is well-developed, normal weight and well-nourished.  HENT:     Head: Normocephalic and atraumatic.     Right Ear: External ear normal.     Left Ear: External ear normal.     Nose: Nose normal.     Mouth/Throat:     Mouth: Mucous membranes are moist.  Eyes:     Conjunctiva/sclera: Conjunctivae normal.  Cardiovascular:     Rate and Rhythm: Normal rate and regular rhythm.     Heart sounds: No murmur heard.   Pulmonary:     Effort: Pulmonary effort is normal. No respiratory distress.     Breath sounds: Normal breath sounds.  Abdominal:     Palpations: Abdomen is soft.     Tenderness: There is no abdominal tenderness.  Musculoskeletal:        General: No edema.     Cervical back: Neck supple.     Right lower leg: No edema.     Left lower leg: No edema.  Skin:    General: Skin is warm and dry.     Capillary Refill: Capillary refill takes less than 2 seconds.  Neurological:     Mental Status: He is alert and oriented to person, place, and time.  Psychiatric:        Mood and Affect: Mood and affect and mood normal.      ____________________________________________   LABS (all labs ordered are listed, but only abnormal results are displayed)  Labs Reviewed  CBC  BASIC METABOLIC PANEL  TROPONIN I (HIGH SENSITIVITY)    ____________________________________________  EKG  Sinus tachycardia with ventricular rate of 104, normal axis, unremarkable intervals, artifact in V1 without any evidence of acute ischemia or other significant underlying arrhythmia.  ____________________________________________  RADIOLOGY  ED MD interpretation: No focal consolidation, large effusion, pneumothorax, overt edema, or other clear acute intrathoracic process.   Official radiology report(s): DG Chest 2 View  Result Date: 12/28/2020 CLINICAL DATA:  Shortness of breath Chest pain Fever Productive cough Vomiting EXAM: CHEST - 2 VIEW COMPARISON:  09/21/2020 FINDINGS: The heart size and mediastinal contours are within normal limits. Both lungs are clear. The visualized skeletal structures are unremarkable. IMPRESSION: No active cardiopulmonary  disease. Electronically Signed   By: Acquanetta Belling M.D.   On: 12/28/2020 13:33    ____________________________________________   PROCEDURES  Procedure(s) performed (including Critical Care):  Procedures   ____________________________________________   INITIAL IMPRESSION / ASSESSMENT AND PLAN / ED COURSE      Patient presents with above-stated history exam for evaluation of above-noted constellation of symptoms after recent COVID-19 exposure.  On arrival patient is afebrile hemodynamically stable.  On exam he has moist mucous membranes and his lungs are clear bilaterally and his abdomen is soft nontender throughout.  Chest x-ray shows no evidence of bacterial pneumonia, heart failure, effusion or other clear acute obstructive process.  Given relatively reassuring EKG without elevated troponin obtained greater than 3 hours after symptom onset low suspicion for ACS or myocarditis.  No arrhythmia evident on ECG.  CBC is unremarkable for acute anemia that would explain Patient shortness of breath and there is no leukocytosis.  Given absence of focal findings on chest x-ray or lung  auscultation and no leukocytosis it was suspicion for acute superinfection with bacterial pneumonia at this time.  BMP shows no significant metabolic derangements.  Impression is a viral syndrome with COVID-19 high on the differential.  PCR sent and isolation precautions discussed.  Patient given Zofran Maalox and a dose of Toradol in the ED.  Rx written for Zofran.  Refill of antacid prescribed.  Short course of Zofran prescribed.  Discharged stable condition.  Strict precautions advised and discussed.       ____________________________________________   FINAL CLINICAL IMPRESSION(S) / ED DIAGNOSES  Final diagnoses:  None    Medications - No data to display   ED Discharge Orders    None       Note:  This document was prepared using Dragon voice recognition software and may include unintentional dictation errors.   Gilles Chiquito, MD 12/28/20 1409

## 2020-12-28 NOTE — Telephone Encounter (Signed)
Pt called in c/o excessive vomiting and coughing.  His wife has covid and now he is sure he has it.    See notes below.  I have referred him to the ED due to non stop vomiting and not being able to keep anything down.   His throat and esophagus are burning.  He was agreeable to going to the ED.  Going to [facility].   Does not have a PCP,  Reason for Disposition . SEVERE or constant chest pain or pressure (Exception: mild central chest pain, present only when coughing)  Answer Assessment - Initial Assessment Questions 1. COVID-19 DIAGNOSIS: "Who made your COVID-19 diagnosis?" "Was it confirmed by a positive lab test?" If not diagnosed by a HCP, ask "Are there lots of cases (community spread) where you live?" Note: See public health department website, if unsure.     Pt calling in with symptoms of covid.   His wife is positive for covid. I'm vomiting when I cough 2. COVID-19 EXPOSURE: "Was there any known exposure to COVID before the symptoms began?" CDC Definition of close contact: within 6 feet (2 meters) for a total of 15 minutes or more over a 24-hour period.      Yes to wife 3. ONSET: "When did the COVID-19 symptoms start?"      Wed.   I'm vomiting when I cough.   I have pressure in my chest.  No cardiac or lung problem.    4. WORST SYMPTOM: "What is your worst symptom?" (e.g., cough, fever, shortness of breath, muscle aches)     Vomiting   101.6 fever but it's broke now.   I had a severe headache, taste is gone,  This morning I have burning feeling in my throat and down into my stomach. 5. COUGH: "Do you have a cough?" If Yes, ask: "How bad is the cough?"       Yes   6. FEVER: "Do you have a fever?" If Yes, ask: "What is your temperature, how was it measured, and when did it start?"     Not now but I did 101.6 7. RESPIRATORY STATUS: "Describe your breathing?" (e.g., shortness of breath, wheezing, unable to speak)      My chest is burning really bad.   I can't stop vomiting.   8.  BETTER-SAME-WORSE: "Are you getting better, staying the same or getting worse compared to yesterday?"  If getting worse, ask, "In what way?"     Worse 9. HIGH RISK DISEASE: "Do you have any chronic medical problems?" (e.g., asthma, heart or lung disease, weak immune system, obesity, etc.)     No 10. VACCINE: "Have you gotten the COVID-19 vaccine?" If Yes ask: "Which one, how many shots, when did you get it?"       Yes 2 vaccines.   11. PREGNANCY: "Is there any chance you are pregnant?" "When was your last menstrual period?"       N/A 12. OTHER SYMPTOMS: "Do you have any other symptoms?"  (e.g., chills, fatigue, headache, loss of smell or taste, muscle pain, sore throat; new loss of smell or taste especially support the diagnosis of COVID-19)       Headache, fatigue, excessive vomiting, coughing, gagging, body aches, did have fever but it's gone now.  Loss of taste.  Protocols used: CORONAVIRUS (COVID-19) DIAGNOSED OR SUSPECTED-A-AH

## 2020-12-28 NOTE — ED Triage Notes (Signed)
  COVID +, cough, body achest, chest pain intermittently.

## 2020-12-29 ENCOUNTER — Other Ambulatory Visit: Payer: No Typology Code available for payment source

## 2020-12-29 LAB — SARS CORONAVIRUS 2 (TAT 6-24 HRS): SARS Coronavirus 2: POSITIVE — AB

## 2021-03-10 ENCOUNTER — Emergency Department: Payer: Self-pay

## 2021-03-10 ENCOUNTER — Other Ambulatory Visit: Payer: Self-pay

## 2021-03-10 ENCOUNTER — Emergency Department
Admission: EM | Admit: 2021-03-10 | Discharge: 2021-03-10 | Disposition: A | Discharge status: Home / Self Care | Payer: Self-pay | Attending: Emergency Medicine | Admitting: Emergency Medicine

## 2021-03-10 DIAGNOSIS — R2232 Localized swelling, mass and lump, left upper limb: Secondary | ICD-10-CM | POA: Insufficient documentation

## 2021-03-10 DIAGNOSIS — M25511 Pain in right shoulder: Secondary | ICD-10-CM | POA: Insufficient documentation

## 2021-03-10 DIAGNOSIS — M25512 Pain in left shoulder: Secondary | ICD-10-CM | POA: Insufficient documentation

## 2021-03-10 DIAGNOSIS — R519 Headache, unspecified: Secondary | ICD-10-CM

## 2021-03-10 DIAGNOSIS — G44221 Chronic tension-type headache, intractable: Secondary | ICD-10-CM | POA: Insufficient documentation

## 2021-03-10 DIAGNOSIS — Z87891 Personal history of nicotine dependence: Secondary | ICD-10-CM | POA: Insufficient documentation

## 2021-03-10 LAB — URINALYSIS, COMPLETE (UACMP) WITH MICROSCOPIC
Bacteria, UA: NONE SEEN
Bilirubin Urine: NEGATIVE
Glucose, UA: NEGATIVE mg/dL
Hgb urine dipstick: NEGATIVE
Ketones, ur: NEGATIVE mg/dL
Leukocytes,Ua: NEGATIVE
Nitrite: NEGATIVE
Protein, ur: NEGATIVE mg/dL
Specific Gravity, Urine: 1.024 (ref 1.005–1.030)
pH: 5 (ref 5.0–8.0)

## 2021-03-10 LAB — BASIC METABOLIC PANEL
Anion gap: 8 (ref 5–15)
BUN: 13 mg/dL (ref 6–20)
CO2: 24 mmol/L (ref 22–32)
Calcium: 9.3 mg/dL (ref 8.9–10.3)
Chloride: 101 mmol/L (ref 98–111)
Creatinine, Ser: 0.8 mg/dL (ref 0.61–1.24)
GFR, Estimated: >60 mL/min (ref >60)
Glucose, Bld: 110 mg/dL — ABNORMAL HIGH (ref 70–99)
Potassium: 3.4 mmol/L — ABNORMAL LOW (ref 3.5–5.1)
Sodium: 133 mmol/L — ABNORMAL LOW (ref 135–145)

## 2021-03-10 LAB — CBC
HCT: 44.3 % (ref 39.0–52.0)
Hemoglobin: 14.9 g/dL (ref 13.0–17.0)
MCH: 29.2 pg (ref 26.0–34.0)
MCHC: 33.6 g/dL (ref 30.0–36.0)
MCV: 86.7 fL (ref 80.0–100.0)
Platelets: 286 10*3/uL (ref 150–400)
RBC: 5.11 MIL/uL (ref 4.22–5.81)
RDW: 12.8 % (ref 11.5–15.5)
WBC: 9.2 10*3/uL (ref 4.0–10.5)
nRBC: 0 % (ref 0.0–0.2)

## 2021-03-10 NOTE — ED Provider Notes (Signed)
[facility] Emergency Department Provider Note  ____________________________________________   Event Date/Time   First MD Initiated Contact with Patient 03/10/21 0300     (approximate)  I have reviewed the triage vital signs and the nursing notes.   HISTORY  Chief Complaint Shoulder Pain (Bilateral/) and Eye Problem (Blurred vision left eye)    HPI Lee Sandoval is a 30 y.o. male with medical and psychiatric history as listed below who presents for variety of complaints.  For about a year he has been having issues where he will have some pain in the left side of his head and face with some numbness and tingling that radiates down into his neck and eventually down into both of his arms and hands and down into his legs and feet.  The symptoms are somewhat inconsistent but it happens with some regularity.  He has a small nodule on the inside of his left forearm which she said that sometimes gets bigger and sometimes stays smaller but usually it is about the width of a pencil eraser and he wonders if this could somehow be related to the pain he is experiencing.  Recently had some pain and both of his shoulders that was rating down his arms as well.  He has no neck pain.  He has no weakness in his extremities and no difficulty with ambulation or with coordination.  Nothing particular seems to cause the symptoms or make them better or worse.  He has been evaluated in the past for chest pain that he was told was due to anxiety.  He does not have a regular doctor with whom he has followed up.  He denies fever, shortness of breath, cough, nausea, vomiting, and abdominal pain.         Past Medical History:  Diagnosis Date  . Attention deficit hyperactivity disorder (ADHD), combined type, mild   . Generalized anxiety disorder     Patient Active Problem List   Diagnosis Date Noted  . Acute appendicitis with localized peritonitis 08/08/2019  . 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 Concussion without loss of consciousness 12/04/2015  . Intermittent palpitations 07/04/2015  . GAD (generalized anxiety disorder) 07/04/2015  . Cigarette smoker 06/12/2015  . 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 Clinical depression 08/08/2008  . Obsessive-compulsive disorder 08/08/2008    Past Surgical History:  Procedure Laterality Date  . LAPAROSCOPIC APPENDECTOMY N/A 08/09/2019   Procedure: APPENDECTOMY LAPAROSCOPIC;  Surgeon: Carolan Shiver, MD;  Location: [facility] ORS;  Service: General;  Laterality: N/A;  . TONSILLECTOMY    . TOOTH EXTRACTION      Prior to Admission medications   Medication Sig Start Date End Date Taking? Authorizing Provider  butalbital-acetaminophen-caffeine (FIORICET) 50-325-40 MG tablet Take 1-2 tablets by mouth every 6 (six) hours as needed for headache. 09/04/20 09/04/21  Minna Antis, MD  ondansetron (ZOFRAN) 4 MG tablet Take 1 tablet (4 mg total) by mouth every 8 (eight) hours as needed for up to 10 doses for nausea or vomiting. 12/28/20   Gilles Chiquito, MD  pantoprazole (PROTONIX) 40 MG tablet Take 1 tablet (40 mg total) by mouth daily. 12/28/20 12/28/21  Gilles Chiquito, MD    Allergies Sulfa antibiotics and Tramadol hcl  Family History  Problem Relation Age of Onset  . Heart failure Father        died at age 79  . Depression Mother   .  Mental illness Mother     Social History Social History   Tobacco Use  . Smoking status: Former Smoker    Packs/day: 1.00    Types: Cigarettes  . Smokeless tobacco: Current User  Vaping Use  . Vaping Use: Some days  Substance Use Topics  . Alcohol use: Not Currently    Alcohol/week: 0.0 standard drinks    Comment: occasionally   . Drug use: Not Currently    Types: Marijuana    Comment: last smoked sunday morning    Review of Systems Constitutional: No fever/chills Eyes: No  visual changes. ENT: No sore throat. Cardiovascular: Occasional chest pain in the past. Respiratory: Denies shortness of breath. Gastrointestinal: No abdominal pain.  No nausea, no vomiting.  No diarrhea.  No constipation. Genitourinary: Negative for dysuria. Musculoskeletal: Pain that seems to start in the left part of his head and radiate down into all of his extremities and down into his back and shoulders. Integumentary: Negative for rash. Neurological: Occasional tingling or numbness and 1 or more of his extremities and sometimes all of them, but no focal weakness.   ____________________________________________   PHYSICAL EXAM:  VITAL SIGNS: ED Triage Vitals  Enc Vitals Group     BP 03/10/21 0206 127/86     Pulse Rate 03/10/21 0206 74     Resp 03/10/21 0206 20     Temp 03/10/21 0206 98.2 F (36.8 C)     Temp Source 03/10/21 0206 Oral     SpO2 03/10/21 0206 96 %     Weight 03/10/21 0155 99.8 kg (220 lb)     Height 03/10/21 0155 1.88 m (6\' 2" )     Head Circumference --      Peak Flow --      Pain Score 03/10/21 0154 7     Pain Loc --      Pain Edu? --      Excl. in GC? --     Constitutional: Alert and oriented.  Eyes: Conjunctivae are normal.  Head: Atraumatic. Nose: No congestion/rhinnorhea. Mouth/Throat: Patient is wearing a mask. Neck: No stridor.  No meningeal signs.   Cardiovascular: Normal rate, regular rhythm. Good peripheral circulation. Respiratory: Normal respiratory effort.  No retractions. Gastrointestinal: Soft and nontender. No distention.  Musculoskeletal: No lower extremity tenderness nor edema. No gross deformities of extremities. Neurologic:  Normal speech and language. No gross focal neurologic deficits are appreciated.  Skin:  Skin is warm, dry and intact.  Patient has a small nodule on his left anterior proximal forearm that is soft and fleshy, about 1 cm in diameter, consistent with either a subdermal cyst or perhaps a lymph node.  No indication  of infectious process. Psychiatric: Mood and affect are somewhat anxious but generally normal.  ____________________________________________   LABS (all labs ordered are listed, but only abnormal results are displayed)  Labs Reviewed  BASIC METABOLIC PANEL - Abnormal; Notable for the following components:      Result Value   Sodium 133 (*)    Potassium 3.4 (*)    Glucose, Bld 110 (*)    All other components within normal limits  URINALYSIS, COMPLETE (UACMP) WITH MICROSCOPIC - Abnormal; Notable for the following components:   Color, Urine YELLOW (*)    APPearance CLEAR (*)    All other components within normal limits  CBC  CBG MONITORING, ED   ____________________________________________  EKG  ED ECG REPORT I, Loleta Roseory Forbach, the attending physician, personally viewed and interpreted this ECG.  Date: 03/10/2021  EKG Time: 1:55 AM Rate: 82 Rhythm: normal sinus rhythm with sinus arrhythmia QRS Axis: normal Intervals: normal ST/T Wave abnormalities: normal Narrative Interpretation: no evidence of acute ischemia  ____________________________________________  RADIOLOGY I, Loleta Rose, personally viewed and evaluated these images (plain radiographs) as part of my medical decision making, as well as reviewing the written report by the radiologist.  ED MD interpretation: No acute abnormality identified on head CT.  Official radiology report(s): CT Head Wo Contrast  Result Date: 03/10/2021 CLINICAL DATA:  Dizziness and headache.  Classic migraine EXAM: CT HEAD WITHOUT CONTRAST TECHNIQUE: Contiguous axial images were obtained from the base of the skull through the vertex without intravenous contrast. COMPARISON:  05/17/2020 FINDINGS: Brain: No evidence of acute infarction, hemorrhage, hydrocephalus, extra-axial collection or mass lesion/mass effect. Vascular: No hyperdense vessel or unexpected calcification. Skull: Normal. Negative for fracture or focal lesion. Sinuses/Orbits: No  acute finding. IMPRESSION: Negative head CT. Electronically Signed   By: Marnee Spring M.D.   On: 03/10/2021 05:24    ____________________________________________   PROCEDURES   Procedure(s) performed (including Critical Care):  Procedures   ____________________________________________   INITIAL IMPRESSION / MDM / ASSESSMENT AND PLAN / ED COURSE  As part of my medical decision making, I reviewed the following data within the electronic MEDICAL RECORD NUMBER Nursing notes reviewed and incorporated, Labs reviewed , EKG interpreted , Old chart reviewed and Notes from prior ED visits   Differential diagnosis includes, but is not limited to, anxiety, ACS, PE, electrolyte or metabolic abnormality, acute infection, cervical radiculopathy, lumbar radiculopathy.  Patient has normal and stable vital signs.  He is well-appearing and in no distress.  His symptoms been going on for about a year although they are intermittent and waxing and waning.  He has no neck pain and no reproducible tenderness to palpation.  No focal neurological deficits.  EKG is nonischemic.  Lab work is within normal limits including basic metabolic panel, CBC, and urinalysis.  He has been seen recently for evaluation of atypical chest pain as well.  He currently is not having any of his symptoms.  I discussed with the patient the possibility that he is feeling random and phantom pains but that his anxiety is making it worse and he is worried that bad things are going on, such as the symptoms in his head, chest, and back being related to the small skin abnormality on his forearm.  I encouraged him to establish a primary care provider and I provided a couple of numbers that may assist him with this.  On the chance that there was an intracranial abnormality such as a tumor that was causing some of his issues I obtained a head CT but it was reassuring.  Patient understands and agrees with the plan and I gave my usual and customary  return precautions.           ____________________________________________  FINAL CLINICAL IMPRESSION(S) / ED DIAGNOSES  Final diagnoses:  Chronic nonintractable headache, unspecified headache type     MEDICATIONS GIVEN DURING THIS VISIT:  Medications - No data to display   ED Discharge Orders    None      *Please note:  Gracyn Santillanes was evaluated in Emergency Department on 03/10/2021 for the symptoms described in the history of present illness. He was evaluated in the context of the global COVID-19 pandemic, which necessitated consideration that the patient might be at risk for infection with the SARS-CoV-2 virus that causes COVID-19. Institutional protocols and algorithms that  pertain to the evaluation of patients at risk for COVID-19 are in a state of rapid change based on information released by regulatory bodies including the CDC and federal and state organizations. These policies and algorithms were followed during the patient's care in the ED.  Some ED evaluations and interventions may be delayed as a result of limited staffing during and after the pandemic.*  Note:  This document was prepared using Dragon voice recognition software and may include unintentional dictation errors.   Loleta Rose, MD 03/10/21 [phone]

## 2021-03-10 NOTE — ED Triage Notes (Signed)
Pt arrives to ED from home via PoV with c/c of bilateral shouder pain radiating down arms and blurred vision in left eye. Sx onset 2 days ago. Pt complaining of dizziness starting in the last hour. Upon arrival, pt A&Ox4, ambulatory. Denies F/C, Vomiting/diarrhea.

## 2021-03-10 NOTE — Discharge Instructions (Addendum)
Your workup in the Emergency Department today was reassuring.  We did not find any specific abnormalities.  We recommend you drink plenty of fluids, take your regular medications and/or any new ones prescribed today, and follow up with the doctor(s) listed in these documents as recommended.  Return to the Emergency Department if you develop new or worsening symptoms that concern you.  

## 2021-03-10 NOTE — ED Notes (Signed)
Pt presents with multiple complaints. Reports intermittent left sided headaches with pain radiating down neck and into shoulder, "pinching sensations" to bilateral shoulders, chest, and BLE, reports he feels a "haze" over his left eye when he firsts opens his eye, and states he has noticed his left eye twitching for approx 1 year. Also reports a small bump to left forearm, reports this has been there for 1 year but changes in size. Denies any recent injuries/head trauma. Reports he had covid in January and is still noticing residual SOB with exertion. Pt reports hx of anxiety, states he feels he has been "getting emotional very easily lately." pt pleasant, NIH 0, A&Ox4.

## 2021-04-19 ENCOUNTER — Telehealth (INDEPENDENT_AMBULATORY_CARE_PROVIDER_SITE_OTHER): Payer: Self-pay | Admitting: Family Medicine

## 2021-04-19 ENCOUNTER — Ambulatory Visit: Payer: Self-pay | Admitting: *Deleted

## 2021-04-19 ENCOUNTER — Encounter: Payer: Self-pay | Admitting: Family Medicine

## 2021-04-19 VITALS — Ht 74.0 in | Wt 220.0 lb

## 2021-04-19 DIAGNOSIS — L0291 Cutaneous abscess, unspecified: Secondary | ICD-10-CM

## 2021-04-19 DIAGNOSIS — F332 Major depressive disorder, recurrent severe without psychotic features: Secondary | ICD-10-CM

## 2021-04-19 MED ORDER — DOXYCYCLINE HYCLATE 100 MG PO TABS: 100.0000 mg | ORAL_TABLET | Freq: Two times a day (BID) | ORAL | 0 refills | Status: DC

## 2021-04-19 NOTE — Progress Notes (Signed)
Name: Lee Sandoval   MRN: [id number]    DOB: 1991-06-16   Date:04/19/2021       Progress Note  Subjective  Chief Complaint  Chief Complaint  Patient presents with  . Tick Removal    Tick bite    I connected with  York Cerise  on 04/19/21 at 11:20 AM EDT by a video enabled telemedicine application and verified that I am speaking with the correct person using two identifiers.  I discussed the limitations of evaluation and management by telemedicine and the availability of in person appointments. The patient expressed understanding and agreed to proceed with the virtual visit  Staff also discussed with the patient that there may be a patient responsible charge related to this service. Patient Location: at home  Provider Location: [facility] Additional Individuals present: wife and children   HPI  Abscess: he states he helped burn some bushes at his grandmother's house on Tuesday, he removed a tick from right lower abdominal area that evening, the following morning she noticed a spot about 2 inches medially from that spot , with a small head in the center that ruptured and drained some pus, the area surrounding it was red and has increased in size, he has also noticed lower leg cramping . He called because family members and friends are concerned about lyme disease    Patient Active Problem List   Diagnosis Date Noted  . Acute appendicitis with localized peritonitis 08/08/2019  . 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 Concussion without loss of consciousness 12/04/2015  . Intermittent palpitations 07/04/2015  . GAD (generalized anxiety disorder) 07/04/2015  . Cigarette smoker 06/12/2015  . Herniated lumbar intervertebral disc 04/19/2014  . 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 Clinical depression 08/08/2008  . Obsessive-compulsive disorder 08/08/2008    Past Surgical History:  Procedure Laterality Date  . LAPAROSCOPIC  APPENDECTOMY N/A 08/09/2019   Procedure: APPENDECTOMY LAPAROSCOPIC;  Surgeon: Carolan Shiver, MD;  Location: [facility] ORS;  Service: General;  Laterality: N/A;  . TONSILLECTOMY    . TOOTH EXTRACTION      Family History  Problem Relation Age of Onset  . Heart failure Father        died at age 4  . Depression Mother   . Mental illness Mother      Current Outpatient Medications:  .  doxycycline (VIBRA-TABS) 100 MG tablet, Take 1 tablet (100 mg total) by mouth 2 (two) times daily., Disp: 14 tablet, Rfl: 0 .  pantoprazole (PROTONIX) 40 MG tablet, Take 1 tablet (40 mg total) by mouth daily., Disp: 90 tablet, Rfl: 3  Allergies  Allergen Reactions  . Sulfa Antibiotics Hives    High fever.   . Tramadol Hcl Other (See Comments)    Reaction: unknown    I personally reviewed active problem list, medication list, allergies, family history, social history with the patient/caregiver today.   ROS   Ten systems reviewed and is negative except as mentioned in HPI   Objective  Virtual encounter, vitals not obtained.  Body mass index is 28.25 kg/m.  Physical Exam  Awake, alert and oriented Oval area with erythema/pink, on right lower quadrant, not at the tick bite site, likely an abscess. Explained leg cramps could be secondary to her physical activity ( he has DDD lumbar spine) . He does not have lyme disease  PHQ2/9: Depression screen [facility] 2/9 04/19/2021 06/10/2019 09/26/2016 12/04/2015 11/29/2015  Decreased Interest 3 0 0 0  0  Down, Depressed, Hopeless 3 0 0 0 0  PHQ - 2 Score 6 0 0 0 0  Altered sleeping 3 0 - - -  Tired, decreased energy 3 0 - - -  Change in appetite 3 0 - - -  Feeling bad or failure about yourself  3 0 - - -  Trouble concentrating 3 0 - - -  Moving slowly or fidgety/restless 3 0 - - -  Suicidal thoughts 1 0 - - -  PHQ-9 Score 25 0 - - -  Difficult doing work/chores Extremely dIfficult Not difficult at all - - -   PHQ-2/9 Result is positive.   He states  he is human   Fall Risk: Fall Risk  04/19/2021 06/10/2019 09/26/2016 12/04/2015 11/29/2015  Falls in the past year? 1 0 No Yes No  Number falls in past yr: 0 0 - 1 -  Injury with Fall? 0 0 - No -  Risk for fall due to : History of fall(s) - - - -  Follow up Falls prevention discussed - - Falls evaluation completed -     Assessment & Plan  1. Abscess  - doxycycline (VIBRA-TABS) 100 MG tablet; Take 1 tablet (100 mg total) by mouth 2 (two) times daily.  Dispense: 14 tablet; Refill: 0  2. Severe episode of recurrent major depressive disorder, without psychotic features (HCC)  He is depressed, cannot work, and feels down , has DDD lumbar spine  I discussed the assessment and treatment plan with the patient. The patient was provided an opportunity to ask questions and all were answered. The patient agreed with the plan and demonstrated an understanding of the instructions.  The patient was advised to call back or seek an in-person evaluation if the symptoms worsen or if the condition fails to improve as anticipated.  I provided 15  minutes of non-face-to-face time during this encounter.

## 2021-04-19 NOTE — Telephone Encounter (Signed)
Pt called stating he was bitten by a tick on 04/16/21;e was bitten on his lower abdomen beside his right hip; he developed a circular rash with pus; the pt has been having intermittent arm and  leg cramps and pain; he also has a headache  Rated 4 out of 10;  he denies fever; the inch is 1.5 inches in diameter; recommendations given per nurse triage protocol; the pt is seen at [facility]; ok to schedule per Bjorn Loser; pt offered and accepted virtual appt with Dr Carlynn Purl 04/19/21 at 1120; he verbalized understanding; will route to office for notification.    Reason for Disposition . Patient sounds very sick or weak to the triager  Answer Assessment - Initial Assessment Questions 1. TYPE of TICK: "Is it a wood tick or a deer tick?" (e.g., deer tick, wood tick; unsure)    unsure 2. SIZE of TICK: "How big is the tick?" (e.g., size of poppy seed, apple seed, watermelon seed; unsure) Note: Deer ticks can be the size of a poppy seed (nymph) or an apple seed (adult).      Between Apple seed and water melon seed 3. ENGORGED: "Did the tick look flat or engorged (full, swollen)?" (e.g., flat, engorged; unsure)    flat 4. LOCATION: "Where is the tick bite located?"     Lower abdomen near right hip 5. ONSET: "How long do you think the tick was attached before you removed it?" (e.g., 5 hours, 2 days)    1-2 hrs 6. APPEARANCE of BITE or RASH: "What does the site look like?"     Circular rash with pus 7. PREGNANCY: "Is there any chance you are pregnant?" "When was your last menstrual period?"     n/a  Protocols used: TICK BITE-A-AH

## 2021-06-13 ENCOUNTER — Other Ambulatory Visit: Payer: Self-pay

## 2021-06-13 DIAGNOSIS — Z87891 Personal history of nicotine dependence: Secondary | ICD-10-CM | POA: Insufficient documentation

## 2021-06-13 DIAGNOSIS — F411 Generalized anxiety disorder: Secondary | ICD-10-CM | POA: Insufficient documentation

## 2021-06-13 DIAGNOSIS — Y900 Blood alcohol level of less than 20 mg/100 ml: Secondary | ICD-10-CM | POA: Insufficient documentation

## 2021-06-13 DIAGNOSIS — F331 Major depressive disorder, recurrent, moderate: Secondary | ICD-10-CM | POA: Insufficient documentation

## 2021-06-13 DIAGNOSIS — Z20822 Contact with and (suspected) exposure to covid-19: Secondary | ICD-10-CM | POA: Insufficient documentation

## 2021-06-13 DIAGNOSIS — R45851 Suicidal ideations: Secondary | ICD-10-CM | POA: Insufficient documentation

## 2021-06-13 LAB — COMPREHENSIVE METABOLIC PANEL
ALT: 58 U/L — ABNORMAL HIGH (ref 0–44)
AST: 34 U/L (ref 15–41)
Albumin: 4.6 g/dL (ref 3.5–5.0)
Alkaline Phosphatase: 70 U/L (ref 38–126)
Anion gap: 12 (ref 5–15)
BUN: 11 mg/dL (ref 6–20)
CO2: 27 mmol/L (ref 22–32)
Calcium: 9.5 mg/dL (ref 8.9–10.3)
Chloride: 102 mmol/L (ref 98–111)
Creatinine, Ser: 0.86 mg/dL (ref 0.61–1.24)
GFR, Estimated: >60 mL/min (ref >60)
Glucose, Bld: 97 mg/dL (ref 70–99)
Potassium: 3.7 mmol/L (ref 3.5–5.1)
Sodium: 141 mmol/L (ref 135–145)
Total Bilirubin: 0.5 mg/dL (ref 0.3–1.2)
Total Protein: 8.3 g/dL — ABNORMAL HIGH (ref 6.5–8.1)

## 2021-06-13 LAB — CBC
HCT: 47.9 % (ref 39.0–52.0)
Hemoglobin: 16.2 g/dL (ref 13.0–17.0)
MCH: 29.8 pg (ref 26.0–34.0)
MCHC: 33.8 g/dL (ref 30.0–36.0)
MCV: 88.2 fL (ref 80.0–100.0)
Platelets: 284 10*3/uL (ref 150–400)
RBC: 5.43 MIL/uL (ref 4.22–5.81)
RDW: 12.4 % (ref 11.5–15.5)
WBC: 8.6 10*3/uL (ref 4.0–10.5)
nRBC: 0 % (ref 0.0–0.2)

## 2021-06-13 LAB — ETHANOL: Alcohol, Ethyl (B): <10 mg/dL (ref <10)

## 2021-06-13 NOTE — ED Provider Notes (Signed)
[facility] Emergency Department Provider Note   ____________________________________________   Event Date/Time   First MD Initiated Contact with Patient 06/13/21 2324     (approximate)  I have reviewed the triage vital signs and the nursing notes.   HISTORY  Chief Complaint Suicidal    HPI Lee Sandoval is a 30 y.o. male who presents to the ED from home seeking behavioral medicine evaluation.  History of ADHD and generalized anxiety disorder who has been having SI and HI for years but thoughts have worsened recently.  Patient had a plan to overdose today when his wife was at work.  Denies taking any pills.  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 Authorizing Provider  doxycycline (VIBRA-TABS) 100 MG tablet Take 1 tablet (100 mg total) by mouth 2 (two) times daily. 04/19/21   Alba Cory, MD  pantoprazole (PROTONIX) 40 MG tablet Take 1  tablet (40 mg total) by mouth daily. 12/28/20 12/28/21  Gilles Chiquito, MD    Allergies Sulfa antibiotics and Tramadol hcl  Family History  Problem Relation Age of Onset   Heart failure Father        died at age 24   Depression Mother    Mental illness Mother     Social History Social History   Tobacco Use   Smoking status: Former    Packs/day: 1.00    Pack years: 0.00    Types: Cigarettes   Smokeless tobacco: Current  Vaping Use   Vaping Use: Some days  Substance Use Topics   Alcohol use: Not Currently    Alcohol/week: 0.0 standard drinks    Comment: occasionally    Drug use: Not Currently    Types: Marijuana    Comment: last smoked sunday morning    Review of Systems  Constitutional: No fever/chills Eyes: No visual changes. ENT: No sore throat. Cardiovascular: Denies chest pain. Respiratory: Denies shortness of breath. Gastrointestinal: No abdominal pain.  No nausea, no vomiting.  No diarrhea.  No constipation. Genitourinary: Negative for dysuria. Musculoskeletal: Negative for back pain. Skin: Negative for rash. Neurological: Negative for headaches, focal weakness or numbness. Psychiatric: Positive for depression with SI/HI.  ____________________________________________   PHYSICAL EXAM:  VITAL SIGNS: ED Triage Vitals  Enc Vitals Group     BP 06/13/21 2250 (!) 128/93     Pulse Rate 06/13/21 2250 79     Resp 06/13/21 2250 18     Temp 06/13/21 2250 99.1 F (37.3 C)     Temp  Source 06/13/21 2250 Oral     SpO2 06/13/21 2250 100 %     Weight 06/13/21 2247 210 lb (95.3 kg)     Height 06/13/21 2247 6\' 2"  (1.88 m)     Head Circumference --      Peak Flow --      Pain Score 06/13/21 2247 4     Pain Loc --      Pain Edu? --      Excl. in GC? --     Constitutional: Alert and oriented. Well appearing and in no acute distress. Eyes: Conjunctivae are normal. PERRL. EOMI. Head: Atraumatic. Nose: No congestion/rhinnorhea. Mouth/Throat: Mucous membranes  are moist.   Neck: No stridor.   Cardiovascular: Normal rate, regular rhythm. Grossly normal heart sounds.  Good peripheral circulation. Respiratory: Normal respiratory effort.  No retractions. Lungs CTAB. Gastrointestinal: Soft and nontender. No distention. No abdominal bruits. No CVA tenderness. Musculoskeletal: No lower extremity tenderness nor edema.  No joint effusions. Neurologic:  Normal speech and language. No gross focal neurologic deficits are appreciated. No gait instability. Skin:  Skin is warm, dry and intact. No rash noted. Psychiatric: Mood and affect are normal. Speech and behavior are normal.  ____________________________________________   LABS (all labs ordered are listed, but only abnormal results are displayed)  Labs Reviewed  COMPREHENSIVE METABOLIC PANEL - Abnormal; Notable for the following components:      Result Value   Total Protein 8.3 (*)    ALT 58 (*)    All other components within normal limits  RESP PANEL BY RT-PCR (FLU A&B, COVID) ARPGX2  CBC  ETHANOL  URINALYSIS, COMPLETE (UACMP) WITH MICROSCOPIC  URINE DRUG SCREEN, QUALITATIVE ([facility] ONLY)   ____________________________________________  EKG  None ____________________________________________  RADIOLOGY I, Shahmeer Bunn J, personally viewed and evaluated these images (plain radiographs) as part of my medical decision making, as well as reviewing the written report by the radiologist.  ED MD interpretation: None  Official radiology report(s): No results found.  ____________________________________________   PROCEDURES  Procedure(s) performed (including Critical Care):  Procedures   ____________________________________________   INITIAL IMPRESSION / ASSESSMENT AND PLAN / ED COURSE  As part of my medical decision making, I reviewed the following data within the electronic MEDICAL RECORD NUMBER Nursing notes reviewed and incorporated, Labs reviewed, Old chart reviewed, A consult was requested  and obtained from this/these consultant(s) Psychiatry, and Notes from prior ED visits     30 year old male presenting with depression, SI/HI.  Currently engaged in conversation with the ED tech.  Contracts for safety while in the emergency department. The patient has been placed in psychiatric observation due to the need to provide a safe environment for the patient while obtaining psychiatric consultation and evaluation, as well as ongoing medical and medication management to treat the patient's condition.  The patient has not been placed under full IVC at this time.   Clinical Course as of 06/14/21 0430  08/15/21 Jun 14, 2021  Jun 16, 2021 Patient was evaluated by psychiatric NP who recommends inpatient admission.  Patient remains voluntary for now pending admission. [JS]    Clinical Course User Index [JS] 1157, MD     ____________________________________________   FINAL CLINICAL IMPRESSION(S) / ED DIAGNOSES  Final diagnoses:  GAD (generalized anxiety disorder)  Moderate episode of recurrent major depressive disorder [facility])     ED Discharge Orders     None        Note:  This document was prepared using Dragon voice recognition software and  may include unintentional dictation errors.    Irean Hong, MD 06/14/21 0430

## 2021-06-13 NOTE — ED Notes (Signed)
Tshirt Tennis Shoes Necklace with ring Black sweats All locked in [employer] area

## 2021-06-13 NOTE — ED Triage Notes (Signed)
Pt in with co HI and SI states for years but thoughts have worsened recently. Pt had a plan today to overdose when wife went to work.

## 2021-06-14 ENCOUNTER — Emergency Department
Admission: EM | Admit: 2021-06-14 | Discharge: 2021-06-14 | Disposition: A | Discharge status: Home / Self Care | Payer: Medicaid Other | Attending: Emergency Medicine | Admitting: Emergency Medicine

## 2021-06-14 DIAGNOSIS — F331 Major depressive disorder, recurrent, moderate: Secondary | ICD-10-CM

## 2021-06-14 DIAGNOSIS — F32A Depression, unspecified: Secondary | ICD-10-CM | POA: Diagnosis present

## 2021-06-14 DIAGNOSIS — F17210 Nicotine dependence, cigarettes, uncomplicated: Secondary | ICD-10-CM | POA: Diagnosis present

## 2021-06-14 DIAGNOSIS — F411 Generalized anxiety disorder: Secondary | ICD-10-CM

## 2021-06-14 DIAGNOSIS — F429 Obsessive-compulsive disorder, unspecified: Secondary | ICD-10-CM | POA: Diagnosis present

## 2021-06-14 LAB — RESP PANEL BY RT-PCR (FLU A&B, COVID) ARPGX2
Influenza A by PCR: NEGATIVE
Influenza B by PCR: NEGATIVE
SARS Coronavirus 2 by RT PCR: NEGATIVE

## 2021-06-14 MED ORDER — TRAZODONE HCL 50 MG PO TABS: 50.0000 mg | ORAL_TABLET | Freq: Every evening | ORAL | Status: DC | PRN

## 2021-06-14 MED ORDER — HYDROXYZINE HCL 25 MG PO TABS
50.0000 mg | ORAL_TABLET | Freq: Once | ORAL | Status: AC
  Administered 2021-06-14: 50 mg via ORAL
  Filled 2021-06-14: qty 2

## 2021-06-14 MED ORDER — TRAZODONE HCL 50 MG PO TABS: 50.0000 mg | ORAL_TABLET | Freq: Every evening | ORAL | 1 refills | Status: DC | PRN

## 2021-06-14 MED ORDER — GABAPENTIN 100 MG PO CAPS: 100.0000 mg | ORAL_CAPSULE | Freq: Three times a day (TID) | ORAL | Status: DC

## 2021-06-14 MED ORDER — GABAPENTIN 100 MG PO CAPS: 100.0000 mg | ORAL_CAPSULE | Freq: Three times a day (TID) | ORAL | 1 refills | Status: DC

## 2021-06-14 NOTE — BH Assessment (Signed)
Late Entry: Writer spoke with the patient to complete an updated/reassessment. Patient denies SI/HI and AV/H.  Patient provided outpatient referral information to follow up with to address his mental health needs. 

## 2021-06-14 NOTE — ED Notes (Signed)
Hourly rounding reveals patient in room. No complaints, stable, in no acute distress. Q15 minute rounds and monitoring via [employer] Cam[employer] to continue. 

## 2021-06-14 NOTE — ED Notes (Signed)
VOL, pending AM reassesment 

## 2021-06-14 NOTE — BH Assessment (Signed)
Comprehensive Clinical Assessment (CCA) Note  06/14/2021 Lee Sandoval [id number]  Chief Complaint: Patient is a 30 year old male presenting [facility] ED voluntarily seeking assistance for his SI that he had earlier last night. Per triage note Pt in with co HI and SI states for years but thoughts have worsened recently. Pt had a plan today to overdose when wife went to work. During assessment patient appears alert and oriented x4, calm and cooperative. Patient reports "I'm experiencing an episode of irrational thoughts and instead of reacting I cam here." Patient denies any current outpatient treatment but reports treatment in the past "I was prescribed Valium in the past for my anxiety and I took that anytime I would feel anxious." Patient reports that he lives with his wife and 3 children, he reports that he takes care of his children while his wife works 3rd shift "I take care of my grandmother and her finances, anytime the family needs anything they call me." Patient reports earlier today having HI "I wanted to hurt people close to me" and reports SI earlier "but then I thought about just hurting myself." Patient reports that he would never act on his thoughts and denies ever attempting. Patient currently denies HI/AH/VH and does not appear to be responding to any internal or external stimuli.  Per Psyc NP Elenore PaddyJackie Thompson patient to be reassessed Chief Complaint  Patient presents with   Suicidal   Visit Diagnosis: Generalized Anxiety Disorder by hx, Depression    CCA Screening, Triage and Referral (STR)  Patient Reported Information How did you hear about us? Self  Referral name: No data recorded Referral phone number: No data recorded  Whom do you see for routine medical problems? No data recorded Practice/Facility Name: No data recorded Practice/Facility Phone Number: No data recorded Name of Contact: No data recorded Contact Number: No data recorded Contact Fax Number: No data  recorded Prescriber Name: No data recorded Prescriber Address (if known): No data recorded  What Is the Reason for Your Visit/Call Today? Patient is presenting voluntarily due to having SI earlier  How Long Has This Been Causing You Problems? > than 6 months  What Do You Feel Would Help You the Most Today? No data recorded  Have You Recently Been in Any Inpatient Treatment (Hospital/Detox/Crisis Center/28-Day Program)? No data recorded Name/Location of Program/Hospital:No data recorded How Long Were You There? No data recorded When Were You Discharged? No data recorded  Have You [facility] Received Services [facility] Before? No data recorded Who [facility]? No data recorded  Have You Recently Had Any Thoughts About Hurting Yourself? Yes  Are You Planning to Commit Suicide/Harm Yourself At This time? No   Have you Recently Had Thoughts About Hurting Someone Karolee Ohslse? Yes  Explanation: No data recorded  Have You Used Any Alcohol or Drugs in the Past 24 Hours? No  How Long Ago Did You Use Drugs or Alcohol? No data recorded What Did You Use and How Much? No data recorded  Do You Currently Have a Therapist/Psychiatrist? No  Name of Therapist/Psychiatrist: No data recorded  Have You Been Recently Discharged From Any Office Practice or Programs? No  Explanation of Discharge From Practice/Program: No data recorded    CCA Screening Triage Referral Assessment Type of Contact: Face-to-Face  Is this Initial or Reassessment? No data recorded Date Telepsych consult ordered in [facility]:  No data recorded Time Telepsych consult ordered in [facility]:  No data recorded  Patient Reported  Information Reviewed? No data recorded Patient Left Without Being Seen? No data recorded Reason for Not Completing Assessment: No data recorded  Collateral Involvement: No data recorded  Does Patient Have a Court Appointed Legal Guardian? No data recorded Name and Contact of Legal Guardian: No  data recorded If Minor and Not Living with Parent(s), Who has Custody? No data recorded Is CPS involved or ever been involved? Never  Is APS involved or ever been involved? Never   Patient Determined To Be At Risk for Harm To Self or Others Based on Review of Patient Reported Information or Presenting Complaint? No  Method: No data recorded Availability of Means: No data recorded Intent: No data recorded Notification Required: No data recorded Additional Information for Danger to Others Potential: No data recorded Additional Comments for Danger to Others Potential: No data recorded Are There Guns or Other Weapons in Your Home? No data recorded Types of Guns/Weapons: No data recorded Are These Weapons Safely Secured?                            No data recorded Who Could Verify You Are Able To Have These Secured: No data recorded Do You Have any Outstanding Charges, Pending Court Dates, Parole/Probation? No data recorded Contacted To Inform of Risk of Harm To Self or Others: No data recorded  Location of Assessment: [facility] ED   Does Patient Present under Involuntary Commitment? No  IVC Papers Initial File Date: No data recorded  [place] of Residence: [place]   Patient Currently Receiving the Following Services: No data recorded  Determination of Need: Emergent (2 hours)   Options For Referral: No data recorded    CCA Biopsychosocial Intake/Chief Complaint:  No data recorded Current Symptoms/Problems: No data recorded  Patient Reported Schizophrenia/Schizoaffective Diagnosis in Past: No   Strengths: Patient is able to communicate  Preferences: No data recorded Abilities: No data recorded  Type of Services Patient Feels are Needed: No data recorded  Initial Clinical Notes/Concerns: No data recorded  Mental Health Symptoms Depression:   Change in energy/activity; Difficulty Concentrating; Fatigue; Sleep (too much or little)   Duration of Depressive symptoms:   Greater than two weeks   Mania:   None   Anxiety:    Difficulty concentrating; Fatigue; Irritability; Restlessness; Tension; Worrying   Psychosis:   None   Duration of Psychotic symptoms: No data recorded  Trauma:   None   Obsessions:   None   Compulsions:   None   Inattention:   None   Hyperactivity/Impulsivity:   None   Oppositional/Defiant Behaviors:   None   Emotional Irregularity:   None   Other Mood/Personality Symptoms:  No data recorded   Mental Status Exam Appearance and self-care  Stature:   Average   Weight:   Average weight   Clothing:   Casual   Grooming:   Normal   Cosmetic use:   None   Posture/gait:   Normal   Motor activity:   Not Remarkable   Sensorium  Attention:   Normal   Concentration:   Normal   Orientation:   X5   Recall/memory:   Normal   Affect and Mood  Affect:   Appropriate   Mood:   Anxious   Relating  Eye contact:   Normal   Facial expression:   Responsive   Attitude toward examiner:   Cooperative   Thought and Language  Speech flow:  Clear and Coherent  Thought content:   Appropriate to Mood and Circumstances   Preoccupation:   None   Hallucinations:   None   Organization:  No data recorded  [employer] of Knowledge:   Fair   Intelligence:   Average   Abstraction:   Normal   Judgement:   Fair   Dance movement psychotherapist:   Realistic   Insight:   Fair   Decision Making:   Normal   Social Functioning  Social Maturity:   Responsible   Social Judgement:   Normal   Stress  Stressors:   Other (Comment)   Coping Ability:   Exhausted   Skill Deficits:   None   Supports:   Family; Friends/Service system     Religion: Religion/Spirituality Are You A Religious Person?: No  Leisure/Recreation: Leisure / Recreation Do You Have Hobbies?: No  Exercise/Diet: Exercise/Diet Do You Exercise?: No Have You Gained or Lost A Significant Amount of  Weight in the Past Six Months?: No Do You Follow a Special Diet?: No Do You Have Any Trouble Sleeping?: Yes Explanation of Sleeping Difficulties: Patient reports sleeping 2-3 hours   CCA Employment/Education Employment/Work Situation: Employment / Work Situation Employment Situation: Unemployed Patient's Job has Been Impacted by Current Illness: No Has Patient ever Been in Equities trader?: No  Education: Education Is Patient Currently Attending School?: No Did You Have An Individualized Education Program (IIEP): No Did You Have Any Difficulty At [employer]?: No Patient's Education Has Been Impacted by Current Illness: No   CCA Family/Childhood History Family and Relationship History: Family history Marital status: Married What types of issues is patient dealing with in the relationship?: None reported Additional relationship information: None reported Does patient have children?: Yes How many children?: 3 How is patient's relationship with their children?: Patient has a good relationship with his children  Childhood History:  Childhood History By whom was/is the patient raised?: Both parents Did patient suffer any verbal/emotional/physical/sexual abuse as a child?: No Did patient suffer from severe childhood neglect?: No Has patient ever been sexually abused/assaulted/raped as an adolescent or adult?: No Was the patient ever a victim of a crime or a disaster?: No Witnessed domestic violence?: No Has patient been affected by domestic violence as an adult?: No  Child/Adolescent Assessment:     CCA Substance Use Alcohol/Drug Use: Alcohol / Drug Use Pain Medications: See MAR Prescriptions: See MAR Over the Counter: See MAR History of alcohol / drug use?: No history of alcohol / drug abuse                         ASAM's:  Six Dimensions of Multidimensional Assessment  Dimension 1:  Acute Intoxication and/or Withdrawal Potential:      Dimension 2:  Biomedical  Conditions and Complications:      Dimension 3:  Emotional, Behavioral, or Cognitive Conditions and Complications:     Dimension 4:  Readiness to Change:     Dimension 5:  Relapse, Continued use, or Continued Problem Potential:     Dimension 6:  Recovery/Living Environment:     ASAM Severity Score:    ASAM Recommended Level of Treatment:     Substance use Disorder (SUD)    Recommendations for Services/Supports/Treatments:  Reassess  DSM5 Diagnoses: Patient Active Problem List   Diagnosis Date Noted   Acute appendicitis with localized peritonitis 08/08/2019   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   Concussion without loss of  consciousness 12/04/2015   Intermittent palpitations 07/04/2015   GAD (generalized anxiety disorder) 07/04/2015   Cigarette smoker 06/12/2015   Herniated lumbar intervertebral disc 04/19/2014   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

## 2021-06-14 NOTE — Consult Note (Signed)
[facility] Face-to-Face Psychiatry Consult   Reason for Consult: Suicidal Referring Physician:  Dr. Dolores Frame Patient Identification: Lee Sandoval MRN:  [id number] Principal Diagnosis: <principal problem not specified> Diagnosis:  Active Problems:   Cigarette smoker   GAD (generalized anxiety disorder)   Clinical depression   Obsessive-compulsive disorder   Total Time spent with patient: 30 minutes  Subjective: "I know I would never hurt anyone or myself." Lee Sandoval is a 30 y.o. male patient presented to [facility] ED via POV voluntary. The patient voiced he is seeking assistance for his SI that he had earlier last night (06.30.22). The patient shared,  "I'm experiencing an episode of irrational thoughts, and instead of reacting, I came here." The patient denies any current outpatient treatment but reports treatment: "I have been prescribed Valium in the past for my anxiety, and I took that anytime I would feel anxious." The patient says he lives with his wife and three children, ages 33, 38, and 3. He discussed that he takes care of his children while his wife works the 3rd shift "I take care of my grandmother and her finances; anytime the family needs anything, they call me." The patient reports HI earlier today, "I wanted to hurt people close to me," and said SI earlier, "but then I thought about just hurting myself." The patient reports that he would never act on his thoughts and denies ever attempting. The patient was seen face-to-face by this provider; the chart was reviewed and consulted with Dr. Dolores Frame on 06/14/2021 due to the patient's care. Discussed the EDP that the patient remained under observation overnight and will be reassessed in the a.m. to determine if he meets the criteria for psychiatric inpatient admission; he could be discharged home. It was discussed with the EDP that the patient is unsure if he wants to be admitted to the inpatient unit. He voiced he has three small children at  home, and his wife works the third shift. Therefore, he will have to go home and get things in order before returning to the hospital. The patient states he cares for his children, brother, and grandmother. He notes many people rely on his care of them.   On evaluation, the patient is alert and oriented x4, calm and cooperative, and mood-congruent with affect.  The patient does not appear to be responding to internal or external stimuli. Neither is the patient presenting with any delusional thinking. The patient denies auditory or visual hallucinations. The patient admitted suicidal and homicidal ideations to the people closest to him. He voiced, "I know I will not hurt anyone, but I am having these thoughts, and it was concerning."  The patient is not presenting with any psychotic or paranoid behaviors. During an encounter with the patient, he could answer questions appropriately.   HPI:  Per Dr. Dolores Frame, Lee Sandoval is a 30 y.o. male who presents to the ED from home seeking behavioral medicine evaluation.  History of ADHD and generalized anxiety disorder who has been having SI and HI for years but thoughts have worsened recently.  Patient had a plan to overdose today when his wife was at work.  Denies taking any pills.  Voices no medical complaints  Past Psychiatric History:  Attention deficit hyperactivity disorder (ADHD), combined type, mild   Generalized anxiety disorder     Risk to Self:   Risk to Others:   Prior Inpatient Therapy:   Prior Outpatient Therapy:    Past Medical History:  Past Medical History:  Diagnosis Date   Attention deficit hyperactivity disorder (ADHD), combined type, mild    Generalized anxiety disorder     Past Surgical History:  Procedure Laterality Date   LAPAROSCOPIC APPENDECTOMY N/A 08/09/2019   Procedure: APPENDECTOMY LAPAROSCOPIC;  Surgeon: Carolan Shiver, MD;  Location: [facility] ORS;  Service: General;  Laterality: N/A;   TONSILLECTOMY     TOOTH  EXTRACTION     Family History:  Family History  Problem Relation Age of Onset   Heart failure Father        died at age 88   Depression Mother    Mental illness Mother    Family Psychiatric  History:  Social History:  Social History   Substance and Sexual Activity  Alcohol Use Not Currently   Alcohol/week: 0.0 standard drinks   Comment: occasionally      Social History   Substance and Sexual Activity  Drug Use Not Currently   Types: Marijuana   Comment: last smoked sunday morning    Social History   Socioeconomic History   Marital status: Married    Spouse name: Not on file   Number of children: 2   Years of education: Not on file   Highest education level: Not on file  Occupational History   Occupation: Research scientist (medical)  Tobacco Use   Smoking status: Former    Packs/day: 1.00    Pack years: 0.00    Types: Cigarettes   Smokeless tobacco: Current  Vaping Use   Vaping Use: Some days  Substance and Sexual Activity   Alcohol use: Not Currently    Alcohol/week: 0.0 standard drinks    Comment: occasionally    Drug use: Not Currently    Types: Marijuana    Comment: last smoked sunday morning   Sexual activity: Yes    Partners: Female  Other Topics Concern   Not on file  Social History Narrative   Not on file   Social Determinants of Health   Financial Resource Strain: Not on file  Food Insecurity: Not on file  Transportation Needs: Not on file  Physical Activity: Not on file  Stress: Not on file  Social Connections: Not on file   Additional Social History:    Allergies:   Allergies  Allergen Reactions   Sulfa Antibiotics Hives    High fever.    Tramadol Hcl Other (See Comments)    Reaction: unknown    Labs:  Results for orders placed or performed during the hospital encounter of 06/14/21 (from the past 48 hour(s))  CBC     Status: None   Collection Time: 06/13/21 10:54 PM  Result Value Ref Range   WBC 8.6 4.0 - 10.5 K/uL   RBC 5.43 4.22 -  5.81 MIL/uL   Hemoglobin 16.2 13.0 - 17.0 g/dL   HCT 16.1 09.6 - 04.5 %   MCV 88.2 80.0 - 100.0 fL   MCH 29.8 26.0 - 34.0 pg   MCHC 33.8 30.0 - 36.0 g/dL   RDW 40.9 81.1 - 91.4 %   Platelets 284 150 - 400 K/uL   nRBC 0.0 0.0 - 0.2 %    Comment: Performed at [facility], [address] [place], [place] [address]  Comprehensive metabolic panel     Status: Abnormal   Collection Time: 06/13/21 10:54 PM  Result Value Ref Range   Sodium 141 135 - 145 mmol/L   Potassium 3.7 3.5 - 5.1 mmol/L   Chloride 102 98 - 111 mmol/L   CO2 27  22 - 32 mmol/L   Glucose, Bld 97 70 - 99 mg/dL    Comment: Glucose reference range applies only to samples taken after fasting for at least 8 hours.   BUN 11 6 - 20 mg/dL   Creatinine, Ser 5.400.86 0.61 - 1.24 mg/dL   Calcium 9.5 8.9 - 98.110.3 mg/dL   Total Protein 8.3 (H) 6.5 - 8.1 g/dL   Albumin 4.6 3.5 - 5.0 g/dL   AST 34 15 - 41 U/L   ALT 58 (H) 0 - 44 U/L   Alkaline Phosphatase 70 38 - 126 U/L   Total Bilirubin 0.5 0.3 - 1.2 mg/dL   GFR, Estimated >19>60 >14>60 mL/min    Comment: (NOTE) Calculated using the CKD-EPI Creatinine Equation (2021)    Anion gap 12 5 - 15    Comment: Performed at [facility], [address] [place], [place] [address]  Ethanol     Status: None   Collection Time: 06/13/21 10:54 PM  Result Value Ref Range   Alcohol, Ethyl (B) <10 <10 mg/dL    Comment: (NOTE) Lowest detectable limit for serum alcohol is 10 mg/dL.  For medical purposes only. Performed at [facility], [address] [place], [place] [address]   Resp Panel by RT-PCR (Flu A&B, Covid) Nasopharyngeal Swab     Status: None   Collection Time: 06/13/21 11:55 PM   Specimen: Nasopharyngeal Swab; Nasopharyngeal(NP) swabs in vial transport medium  Result Value Ref Range   SARS Coronavirus 2 by RT PCR NEGATIVE NEGATIVE    Comment: (NOTE) SARS-CoV-2 target nucleic acids are NOT DETECTED.  The SARS-CoV-2 RNA is generally detectable in upper  respiratory specimens during the acute phase of infection. The lowest concentration of SARS-CoV-2 viral copies this assay can detect is 138 copies/mL. A negative result does not preclude SARS-Cov-2 infection and should not be used as the sole basis for treatment or other patient management decisions. A negative result may occur with  improper specimen collection/handling, submission of specimen other than nasopharyngeal swab, presence of viral mutation(s) within the areas targeted by this assay, and inadequate number of viral copies(<138 copies/mL). A negative result must be combined with clinical observations, patient history, and epidemiological information. The expected result is Negative.  Fact Sheet for Patients:  BloggerCourse.comhttps://www.fda.gov/media/152166/download  Fact Sheet for Healthcare Providers:  SeriousBroker.ithttps://www.fda.gov/media/152162/download  This test is no t yet approved or cleared by the [place] FDA and  has been authorized for detection and/or diagnosis of SARS-CoV-2 by FDA under an Emergency Use Authorization (EUA). This EUA will remain  in effect (meaning this test can be used) for the duration of the COVID-19 declaration under Section 564(b)(1) of the Act, 21 U.S.C.section 360bbb-3(b)(1), unless the authorization is terminated  or revoked sooner.       Influenza A by PCR NEGATIVE NEGATIVE   Influenza B by PCR NEGATIVE NEGATIVE    Comment: (NOTE) The Xpert Xpress SARS-CoV-2/FLU/RSV plus assay is intended as an aid in the diagnosis of influenza from Nasopharyngeal swab specimens and should not be used as a sole basis for treatment. Nasal washings and aspirates are unacceptable for Xpert Xpress SARS-CoV-2/FLU/RSV testing.  Fact Sheet for Patients: BloggerCourse.comhttps://www.fda.gov/media/152166/download  Fact Sheet for Healthcare Providers: SeriousBroker.ithttps://www.fda.gov/media/152162/download  This test is not yet approved or cleared by the [place] FDA and has been authorized for  detection and/or diagnosis of SARS-CoV-2 by FDA under an Emergency Use Authorization (EUA). This EUA will remain in effect (meaning this test can be used) for the duration  of the COVID-19 declaration under Section 564(b)(1) of the Act, 21 U.S.C. section 360bbb-3(b)(1), unless the authorization is terminated or revoked.  Performed at [facility], [address] [place], [place] [address]     No current facility-administered medications for this encounter.   Current Outpatient Medications  Medication Sig Dispense Refill   doxycycline (VIBRA-TABS) 100 MG tablet Take 1 tablet (100 mg total) by mouth 2 (two) times daily. 14 tablet 0   pantoprazole (PROTONIX) 40 MG tablet Take 1 tablet (40 mg total) by mouth daily. 90 tablet 3    Musculoskeletal: Strength & Muscle Tone: within normal limits Gait & Station: normal Patient leans: N/A  Psychiatric Specialty Exam:  Presentation  General Appearance: Appropriate for Environment  Eye Contact:Good  Speech:Clear and Coherent  Speech Volume:Normal  Handedness:Right   Mood and Affect  Mood:Anxious; Depressed  Affect:Appropriate; Congruent; Depressed   Thought Process  Thought Processes:Coherent  Descriptions of Associations:Intact  Orientation:Full (Time, Place and Person)  Thought Content:Logical  History of Schizophrenia/Schizoaffective disorder:No  Duration of Psychotic Symptoms:No data recorded Hallucinations:Hallucinations: None  Ideas of Reference:None  Suicidal Thoughts:Suicidal Thoughts: Yes, Passive SI Passive Intent and/or Plan: Without Intent; Without Plan  Homicidal Thoughts:Homicidal Thoughts: Yes, Passive HI Passive Intent and/or Plan: Without Intent; Without Plan   Sensorium  Memory:Immediate Good; Recent Good; Remote Good  Judgment:Fair  Insight:Fair   Executive Functions  Concentration:Fair  Attention Span:Good  Recall:Good  Fund of  Knowledge:Good  Language:Good   Psychomotor Activity  Psychomotor Activity:Psychomotor Activity: Normal   Assets  Assets:Communication Skills; Intimacy; Resilience; Social Support   Sleep  Sleep:Sleep: Poor   Physical Exam: Physical Exam Vitals and nursing note reviewed.  Constitutional:      Appearance: Normal appearance.  HENT:     Head: Normocephalic.     Nose: Nose normal.     Mouth/Throat:     Mouth: Mucous membranes are moist.  Eyes:     Conjunctiva/sclera: Conjunctivae normal.  Cardiovascular:     Rate and Rhythm: Normal rate.     Pulses: Normal pulses.  Pulmonary:     Effort: Pulmonary effort is normal.  Musculoskeletal:        General: Normal range of motion.     Cervical back: Normal range of motion and neck supple.  Neurological:     General: No focal deficit present.     Mental Status: He is alert and oriented to person, place, and time. Mental status is at baseline.  Psychiatric:        Attention and Perception: Attention and perception normal.        Mood and Affect: Mood normal.        Speech: Speech normal.        Behavior: Behavior normal.        Thought Content: Thought content normal.        Cognition and Memory: Cognition and memory normal.        Judgment: Judgment normal.   Review of Systems  Psychiatric/Behavioral:  Positive for depression and suicidal ideas. The patient is nervous/anxious and has insomnia.   All other systems reviewed and are negative. Blood pressure (!) 128/93, pulse 79, temperature 99.1 F (37.3 C), temperature source Oral, resp. rate 18, height 6\' 2"  (1.88 m), weight 95.3 kg, SpO2 100 %. Body mass index is 26.96 kg/m.  Treatment Plan Summary: Daily contact with patient to assess and evaluate symptoms and progress in treatment and Plan The patient remained under observation overnight and will be reassessed in  the a.m. to determine if he meets the criteria for psychiatric inpatient admission; he could be discharged  home.  Disposition: Supportive therapy provided about ongoing stressors. The patient remained under observation overnight and will be reassessed in the a.m. to determine if he meets the criteria for psychiatric inpatient admission; he could be discharged home.  Gillermo Murdoch, NP 06/14/2021 4:11 AM

## 2021-06-14 NOTE — ED Notes (Signed)
Pt. Transferred to [facility] from ED to room 2 after screening for contraband. Report to include Situation, Background, Assessment and Recommendations from [employer]. Pt. Oriented to unit including Q15 minute rounds as well as the security cameras for their protection. Patient is alert and oriented, warm and dry in no acute distress. Patient reports HI and SI with a plan to overdose with pills. Denied AVH and pain. Pt. Encouraged to let me know if needs arise.

## 2021-06-14 NOTE — Discharge Instructions (Signed)
[facility]  [place] Address: [address], , [place] [address] Hours:  Open ? Closes 5PM Phone: [phone] 

## 2021-06-14 NOTE — ED Provider Notes (Signed)
Emergency Medicine Observation Re-evaluation Note  Lee Sandoval is a 30 y.o. male, seen on rounds today.  Pt initially presented to the ED for complaints of Suicidal Currently, the patient is laying in bed, denies any complaints.  Physical Exam  BP 118/67 (BP Location: Right Arm)   Pulse 82   Temp 98.6 F (37 C) (Oral)   Resp 17   Ht 6\' 2"  (1.88 m)   Wt 95.3 kg   SpO2 98%   BMI 26.96 kg/m  Physical Exam Constitutional: Resting comfortably. Eyes: Conjunctivae are normal. Head: Atraumatic. Nose: No congestion/rhinnorhea. Mouth/Throat: Mucous membranes are moist. Neck: Normal ROM Cardiovascular: No cyanosis noted. Respiratory: Normal respiratory effort. Gastrointestinal: Non-distended. Genitourinary: deferred Musculoskeletal: No lower extremity tenderness nor edema. Neurologic:  Normal speech and language. No gross focal neurologic deficits are appreciated. Skin:  Skin is warm, dry and intact. No rash noted.   ED Course / MDM  EKG:   I have reviewed the labs performed to date as well as medications administered while in observation.  Recent changes in the last 24 hours include patient evaluated by psychiatry with plan to observe overnight.  Plan  Current plan is for reassessment by psychiatry today. Patient is not under full IVC at this time.   , MD 06/14/21 [phone]

## 2021-06-14 NOTE — Consult Note (Signed)
[facility] ED Discharge  06/14/2021 11:03 AM Lee Sandoval  MRN:  [id number]  Method of visit?: Face to Face   Principal Problem: <principal problem not specified> Discharge Diagnoses: Active Problems:   Generalized anxiety disorder   Cigarette smoker   Clinical depression   Obsessive-compulsive disorder   Subjective: "I'm feeling pretty good."  30 yo male admitted with anxiety and intrusive suicidal/homicidal ideations.  "I would never act on these but they bother me that I even had them."  He has high anxiety with intrusive thoughts and poor sleep which makes these worse.  Last night, he slept well and feels better with no suicidal/homicidal ideations, hallucinations, or substance abuse.  He wanted to start medications to assist his anxiety and was on Valium in the past which he does not want as he would overtake these.  Discussed medications and decided to started gabapentin for anxiety and Trazodone for his sleep.  He is agreeable to follow up with [facility] and feels safe to discharge home.  Psychiatrically stable for discharge.  Total Time spent with patient: 45 minutes  Past Psychiatric History: ADHD, GAD  Past Medical History:  Past Medical History:  Diagnosis Date   Attention deficit hyperactivity disorder (ADHD), combined type, mild    Generalized anxiety disorder     Past Surgical History:  Procedure Laterality Date   LAPAROSCOPIC APPENDECTOMY N/A 08/09/2019   Procedure: APPENDECTOMY LAPAROSCOPIC;  Surgeon: Carolan Shiver, MD;  Location: [facility] ORS;  Service: General;  Laterality: N/A;   TONSILLECTOMY     TOOTH EXTRACTION     Family History:  Family History  Problem Relation Age of Onset   Heart failure Father        died at age 42   Depression Mother    Mental illness Mother    Family Psychiatric  History: see above Social History:  Social History   Substance and Sexual Activity  Alcohol Use Not Currently   Alcohol/week: 0.0 standard drinks   Comment:  occasionally      Social History   Substance and Sexual Activity  Drug Use Not Currently   Types: Marijuana   Comment: last smoked sunday morning    Social History   Socioeconomic History   Marital status: Married    Spouse name: Not on file   Number of children: 2   Years of education: Not on file   Highest education level: Not on file  Occupational History   Occupation: Research scientist (medical)  Tobacco Use   Smoking status: Former    Packs/day: 1.00    Pack years: 0.00    Types: Cigarettes   Smokeless tobacco: Current  Vaping Use   Vaping Use: Some days  Substance and Sexual Activity   Alcohol use: Not Currently    Alcohol/week: 0.0 standard drinks    Comment: occasionally    Drug use: Not Currently    Types: Marijuana    Comment: last smoked sunday morning   Sexual activity: Yes    Partners: Female  Other Topics Concern   Not on file  Social History Narrative   Not on file   Social Determinants of Health   Financial Resource Strain: Not on file  Food Insecurity: Not on file  Transportation Needs: Not on file  Physical Activity: Not on file  Stress: Not on file  Social Connections: Not on file    Tobacco Cessation:  A prescription for an FDA-approved tobacco cessation medication was offered at discharge and the patient refused  Current  Medications: Current Facility-Administered Medications  Medication Dose Route Frequency Provider Last Rate Last Admin   gabapentin (NEURONTIN) capsule 100 mg  100 mg Oral TID Charm Rings, NP       traZODone (DESYREL) tablet 50 mg  50 mg Oral QHS PRN Charm Rings, NP       Current Outpatient Medications  Medication Sig Dispense Refill   doxycycline (VIBRA-TABS) 100 MG tablet Take 1 tablet (100 mg total) by mouth 2 (two) times daily. 14 tablet 0   pantoprazole (PROTONIX) 40 MG tablet Take 1 tablet (40 mg total) by mouth daily. 90 tablet 3   PTA Medications: (Not in a hospital admission)   Musculoskeletal: Strength &  Muscle Tone: within normal limits Gait & Station: normal Patient leans: N/A  Psychiatric Specialty Exam:  Presentation  General Appearance: Appropriate for Environment  Eye Contact:Good  Speech:Clear and Coherent  Speech Volume:Normal  Handedness:Right   Mood and Affect  Mood:  Anxious  Affect:  Anxious  Thought Process  Thought Processes:Coherent  Descriptions of Associations:Intact  Orientation:Full (Time, Place and Person)  Thought Content:Logical  History of Schizophrenia/Schizoaffective disorder:No  Duration of Psychotic Symptoms: None Hallucinations:Hallucinations: None  Ideas of Reference:None  Suicidal Thoughts: None  Homicidal Thoughts: None  Sensorium  Memory:Immediate Good; Recent Good; Remote Good  Judgment:  Good Insight:  Good  Executive Functions  Concentration:  Good Attention Span:Good  Recall:Good  Fund of Knowledge:Good  Language:Good   Psychomotor Activity  Psychomotor Activity:Psychomotor Activity: Normal   Assets  Assets:Communication Skills; Intimacy; Resilience; Social Support   Sleep  Sleep:  Good last night   Physical Exam: Physical Exam Vitals and nursing note reviewed.  Constitutional:      Appearance: Normal appearance.  HENT:     Head: Normocephalic.     Nose: Nose normal.  Pulmonary:     Effort: Pulmonary effort is normal.  Musculoskeletal:        General: Normal range of motion.     Cervical back: Normal range of motion.  Neurological:     General: No focal deficit present.     Mental Status: He is alert and oriented to person, place, and time.  Psychiatric:        Attention and Perception: Attention and perception normal.        Mood and Affect: Mood is anxious.        Speech: Speech normal.        Behavior: Behavior normal. Behavior is cooperative.        Thought Content: Thought content normal.        Cognition and Memory: Cognition and memory normal.        Judgment: Judgment normal.    Review of Systems  Psychiatric/Behavioral:  The patient is nervous/anxious.   All other systems reviewed and are negative. Blood pressure 118/67, pulse 82, temperature 98.6 F (37 C), temperature source Oral, resp. rate 17, height 6\' 2"  (1.88 m), weight 95.3 kg, SpO2 98 %. Body mass index is 26.96 kg/m.   Demographic Factors:  Male and Caucasian  Loss Factors: NA  Historical Factors: NA  Risk Reduction Factors:   Sense of responsibility to family, Living with another person, especially a relative, and Positive social support  Continued Clinical Symptoms:  Anxiety  Cognitive Features That Contribute To Risk:  None    Suicide Risk:  Minimal: No identifiable suicidal ideation.  Patients presenting with no risk factors but with morbid ruminations; may be classified as minimal risk based on the  severity of the depressive symptoms    Plan Of Care/Follow-up recommendations:  General anxiety disorder: -Started gabapentin 100 mg TID -Follow up with [facility]  Insomnia: -Started Trazodone 50 mg at bedtime PRN, may repeat once in an hour if not effective Activity:  as tolerated Diet:  heart healthy diet  Disposition: discharge home Nanine Means, NP 06/14/2021, 11:03 AM

## 2021-06-14 NOTE — ED Notes (Signed)
Patient discharged to home with [facility] resource information.

## 2021-06-18 ENCOUNTER — Other Ambulatory Visit: Payer: Self-pay

## 2021-06-18 ENCOUNTER — Ambulatory Visit: Payer: Self-pay | Admitting: *Deleted

## 2021-06-18 ENCOUNTER — Emergency Department: Payer: Medicaid Other

## 2021-06-18 ENCOUNTER — Emergency Department
Admission: EM | Admit: 2021-06-18 | Discharge: 2021-06-18 | Disposition: A | Discharge status: Home / Self Care | Payer: Medicaid Other | Attending: Emergency Medicine | Admitting: Emergency Medicine

## 2021-06-18 DIAGNOSIS — Z79899 Other long term (current) drug therapy: Secondary | ICD-10-CM | POA: Insufficient documentation

## 2021-06-18 DIAGNOSIS — R0789 Other chest pain: Secondary | ICD-10-CM | POA: Insufficient documentation

## 2021-06-18 DIAGNOSIS — I10 Essential (primary) hypertension: Secondary | ICD-10-CM | POA: Insufficient documentation

## 2021-06-18 DIAGNOSIS — Z87891 Personal history of nicotine dependence: Secondary | ICD-10-CM | POA: Insufficient documentation

## 2021-06-18 DIAGNOSIS — F419 Anxiety disorder, unspecified: Secondary | ICD-10-CM | POA: Insufficient documentation

## 2021-06-18 LAB — BASIC METABOLIC PANEL
Anion gap: 8 (ref 5–15)
BUN: 12 mg/dL (ref 6–20)
CO2: 27 mmol/L (ref 22–32)
Calcium: 9.6 mg/dL (ref 8.9–10.3)
Chloride: 104 mmol/L (ref 98–111)
Creatinine, Ser: 0.97 mg/dL (ref 0.61–1.24)
GFR, Estimated: >60 mL/min (ref >60)
Glucose, Bld: 101 mg/dL — ABNORMAL HIGH (ref 70–99)
Potassium: 4.1 mmol/L (ref 3.5–5.1)
Sodium: 139 mmol/L (ref 135–145)

## 2021-06-18 LAB — CBC
HCT: 46.3 % (ref 39.0–52.0)
Hemoglobin: 15.8 g/dL (ref 13.0–17.0)
MCH: 29.6 pg (ref 26.0–34.0)
MCHC: 34.1 g/dL (ref 30.0–36.0)
MCV: 86.7 fL (ref 80.0–100.0)
Platelets: 266 10*3/uL (ref 150–400)
RBC: 5.34 MIL/uL (ref 4.22–5.81)
RDW: 12.3 % (ref 11.5–15.5)
WBC: 6.6 10*3/uL (ref 4.0–10.5)
nRBC: 0 % (ref 0.0–0.2)

## 2021-06-18 LAB — TROPONIN I (HIGH SENSITIVITY): Troponin I (High Sensitivity): 2 ng/L (ref <18)

## 2021-06-18 MED ORDER — AMLODIPINE BESYLATE 5 MG PO TABS: 2.5000 mg | ORAL_TABLET | Freq: Every day | ORAL | 1 refills | Status: DC

## 2021-06-18 MED ORDER — HYDROXYZINE HCL 25 MG PO TABS: 25.0000 mg | ORAL_TABLET | Freq: Three times a day (TID) | ORAL | 0 refills | Status: DC | PRN

## 2021-06-18 NOTE — Telephone Encounter (Signed)
Reason for Disposition  [1] Systolic BP  >= 160 OR Diastolic >= 100 AND [2] cardiac or neurologic symptoms (e.g., chest pain, difficulty breathing, unsteady gait, blurred vision)  Answer Assessment - Initial Assessment Questions 1. BLOOD PRESSURE: "What is the blood pressure?" "Did you take at least two measurements 5 minutes apart?"     141/107 yesterday. 2. ONSET: "When did you take your blood pressure?"     yesterday 3. HOW: "How did you obtain the blood pressure?" (e.g., visiting nurse, automatic home BP monitor)     Grandmother's home cuff. Not available now 4. HISTORY: "Do you have a history of high blood pressure?"     no 5. MEDICATIONS: "Are you taking any medications for blood pressure?" "Have you missed any doses recently?"     No. 6. OTHER SYMPTOMS: "Do you have any symptoms?" (e.g., headache, chest pain, blurred vision, difficulty breathing, weakness)     Chronic headaches,blurred vision, SOB  Protocols used: Blood Pressure - High-A-AH

## 2021-06-18 NOTE — ED Notes (Signed)
Patient also reports "just not feeling right." Reports he has anxiety and has attacks often that take over his body and cause chest pain and discomfort.

## 2021-06-18 NOTE — ED Provider Notes (Signed)
[facility] Emergency Department Provider Note  ____________________________________________  Time seen: Approximately 3:12 PM  I have reviewed the triage vital signs and the nursing notes.   HISTORY  Chief Complaint Hypertension and Chest Pain    HPI Lee Sandoval is a 30 y.o. male with a history of generalized anxiety disorder and ADHD who comes to the ED complaining of feeling anxious.  He has been trying to get in to follow-up with his primary care doctor over the past several weeks.  Also tried to follow-up with [facility] but was unable to be seen at either place.  He has checked his blood pressure multiple times at home with his family members cough and noted persistent readings of about 150/100.  Denies any acute chest pain shortness of breath back pain abdominal pain dizziness headache or vision changes.  He does report occasional twinges of chest "jitteriness" that he attributes to anxiety and is a longstanding symptom for him.  It is not exertional or pleuritic, last for just a second at a time, nonradiating.  No recent injuries.  He does note that he is to take Valium but this was discontinued by changing providers at his primary care doctor.  Past Medical History:  Diagnosis Date  . Attention deficit hyperactivity disorder (ADHD), combined type, mild   . Generalized anxiety disorder      Patient Active Problem List   Diagnosis Date Noted  . Acute appendicitis with localized peritonitis 08/08/2019  . 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 Concussion without loss of consciousness 12/04/2015  . Intermittent palpitations 07/04/2015  . Generalized anxiety disorder 07/04/2015  . Cigarette smoker 06/12/2015  . Herniated lumbar intervertebral disc 04/19/2014  . 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 Clinical depression 08/08/2008  . Obsessive-compulsive disorder 08/08/2008     Past Surgical History:   Procedure Laterality Date  . LAPAROSCOPIC APPENDECTOMY N/A 08/09/2019   Procedure: APPENDECTOMY LAPAROSCOPIC;  Surgeon: Carolan Shiver, MD;  Location: [facility] ORS;  Service: General;  Laterality: N/A;  . TONSILLECTOMY    . TOOTH EXTRACTION       Prior to Admission medications   Medication Sig Start Date End Date Taking? Authorizing Provider  amLODipine (NORVASC) 5 MG tablet Take 0.5 tablets (2.5 mg total) by mouth daily. 06/18/21  Yes Sharman Cheek, MD  hydrOXYzine (ATARAX/VISTARIL) 25 MG tablet Take 1 tablet (25 mg total) by mouth 3 (three) times daily as needed for anxiety. 06/18/21  Yes Sharman Cheek, MD  gabapentin (NEURONTIN) 100 MG capsule Take 1 capsule (100 mg total) by mouth 3 (three) times daily. 06/14/21   Charm Rings, NP  traZODone (DESYREL) 50 MG tablet Take 1 tablet (50 mg total) by mouth at bedtime as needed for sleep. 06/14/21   Charm Rings, NP  pantoprazole (PROTONIX) 40 MG tablet Take 1 tablet (40 mg total) by mouth daily. 12/28/20 06/14/21  Gilles Chiquito, MD     Allergies Sulfa antibiotics and Tramadol hcl   Family History  Problem Relation Age of Onset  . Heart failure Father        died at age 45  . Depression Mother   . Mental illness Mother     Social History Social History   Tobacco Use  . Smoking status: Former    Packs/day: 1.00    Pack years: 0.00    Types: Cigarettes  . Smokeless tobacco: Current  Vaping Use  . Vaping Use: Some days  Substance  Use Topics  . Alcohol use: Not Currently    Alcohol/week: 0.0 standard drinks    Comment: occasionally   . Drug use: Not Currently    Types: Marijuana    Comment: last smoked sunday morning    Review of Systems  Constitutional:   No fever or chills.  ENT:   No sore throat. No rhinorrhea. Cardiovascular:   No chest pain or syncope. Respiratory:   No dyspnea or cough. Gastrointestinal:   Negative for abdominal pain, vomiting and diarrhea.  Musculoskeletal:   Negative for focal pain  or swelling All other systems reviewed and are negative except as documented above in ROS and HPI.  ____________________________________________   PHYSICAL EXAM:  VITAL SIGNS: ED Triage Vitals  Enc Vitals Group     BP 06/18/21 1246 (!) 157/100     Pulse Rate 06/18/21 1246 85     Resp 06/18/21 1246 18     Temp 06/18/21 1246 98 F (36.7 C)     Temp Source 06/18/21 1246 Oral     SpO2 06/18/21 1246 97 %     Weight --      Height --      Head Circumference --      Peak Flow --      Pain Score 06/18/21 1249 0     Pain Loc --      Pain Edu? --      Excl. in GC? --     Vital signs reviewed, nursing assessments reviewed.   Constitutional:   Alert and oriented. Non-toxic appearance. Eyes:   Conjunctivae are normal. EOMI. PERRL. ENT      Head:   Normocephalic and atraumatic.      Nose:   Normal .      Mouth/Throat:   Normal, moist mucosa      Neck:   No meningismus. Full ROM. Hematological/Lymphatic/Immunilogical:   No cervical lymphadenopathy. Cardiovascular:   RRR. Symmetric bilateral radial and DP pulses.  No murmurs. Cap refill less than 2 seconds. Respiratory:   Normal respiratory effort without tachypnea/retractions. Breath sounds are clear and equal bilaterally. No wheezes/rales/rhonchi. Gastrointestinal:   Soft and nontender. Non distended. There is no CVA tenderness.  No rebound, rigidity, or guarding. Genitourinary:   deferred Musculoskeletal:   Normal range of motion in all extremities. No joint effusions.  No lower extremity tenderness.  No edema. Neurologic:   Normal speech and language.  Motor grossly intact. No acute focal neurologic deficits are appreciated.  Skin:    Skin is warm, dry and intact. No rash noted.  No petechiae, purpura, or bullae.  ____________________________________________    LABS (pertinent positives/negatives) (all labs ordered are listed, but only abnormal results are displayed) Labs Reviewed  BASIC METABOLIC PANEL - Abnormal; Notable  for the following components:      Result Value   Glucose, Bld 101 (*)    All other components within normal limits  CBC  TROPONIN I (HIGH SENSITIVITY)   ____________________________________________   EKG  Interpreted by me  Date: 06/18/2021  Rate: 92  Rhythm: normal sinus rhythm  QRS Axis: normal  Intervals: normal  ST/T Wave abnormalities: normal  Conduction Disutrbances: none  Narrative Interpretation: unremarkable     ____________________________________________    RADIOLOGY  DG Chest 2 View  Result Date: 06/18/2021 CLINICAL DATA:  Chest pain EXAM: CHEST - 2 VIEW COMPARISON:  12/28/2020 FINDINGS: The heart size and mediastinal contours are within normal limits. Both lungs are clear. The visualized skeletal structures are unremarkable.  IMPRESSION: No active cardiopulmonary disease. Electronically Signed   By: Acquanetta Belling M.D.   On: 06/18/2021 13:11    ____________________________________________   PROCEDURES Procedures  ____________________________________________    CLINICAL IMPRESSION / ASSESSMENT AND PLAN / ED COURSE  Medications ordered in the ED: Medications - No data to display  Pertinent labs & imaging results that were available during my care of the patient were reviewed by me and considered in my medical decision making (see chart for details).  Lee Sandoval was evaluated in Emergency Department on 06/18/2021 for the symptoms described in the history of present illness. He was evaluated in the context of the global COVID-19 pandemic, which necessitated consideration that the patient might be at risk for infection with the SARS-CoV-2 virus that causes COVID-19. Institutional protocols and algorithms that pertain to the evaluation of patients at risk for COVID-19 are in a state of rapid change based on information released by regulatory bodies including the CDC and federal and state organizations. These policies and algorithms were followed during  the patient's care in the ED.   Patient presents with moderately elevated blood pressure which has been confirmed with multiple checks including here in the ED where is 157/100 today.  Chest pain is atypical and related to anxiety. Considering the patient's symptoms, medical history, and physical examination today, I have low suspicion for ACS, PE, TAD, pneumothorax, carditis, mediastinitis, pneumonia, CHF, or sepsis. Will prescribe low-dose Norvasc of 2.5 mg daily.  Hydroxyzine for anxiety.  Continue to follow-up with primary care when able.  Clinical Course as of 06/18/21 1516  Tue Jun 18, 2021  1515 Chest x-ray unremarkable [PS]    Clinical Course User Index [PS] Sharman Cheek, MD     ____________________________________________   FINAL CLINICAL IMPRESSION(S) / ED DIAGNOSES    Final diagnoses:  Anxiety  Atypical chest pain  Hypertension, unspecified type     ED Discharge Orders          Ordered    hydrOXYzine (ATARAX/VISTARIL) 25 MG tablet  3 times daily PRN        06/18/21 1512    amLODipine (NORVASC) 5 MG tablet  Daily        06/18/21 1512            Portions of this note were generated with dragon dictation software. Dictation errors may occur despite best attempts at proofreading.   Sharman Cheek, MD 06/18/21 1515

## 2021-06-18 NOTE — ED Triage Notes (Signed)
Pt c/o his b/p being elevated, pt c/o having left sided chest pain . States he just hasnt been feeling well and used someone else b/o machine and it was elevated at 145/107

## 2021-06-18 NOTE — ED Notes (Signed)
[facility] called - prescriptions ordered.

## 2021-06-18 NOTE — Telephone Encounter (Signed)
Pt reports BP yesterday 141/107. Checked at grandparents house, home monitor, not available presently. Reports seen in ED 06/14/21, anxiety. States feels mostly resolved now, "But I think my BP has been high for a while." Reports headache and blurred vision at times. No PCP. ADvised UC. States will follow disposition.

## 2021-07-17 ENCOUNTER — Other Ambulatory Visit: Payer: Self-pay

## 2021-07-17 DIAGNOSIS — Z87891 Personal history of nicotine dependence: Secondary | ICD-10-CM | POA: Insufficient documentation

## 2021-07-17 DIAGNOSIS — R55 Syncope and collapse: Secondary | ICD-10-CM | POA: Insufficient documentation

## 2021-07-17 DIAGNOSIS — F419 Anxiety disorder, unspecified: Secondary | ICD-10-CM | POA: Insufficient documentation

## 2021-07-17 LAB — COMPREHENSIVE METABOLIC PANEL
ALT: 46 U/L — ABNORMAL HIGH (ref 0–44)
AST: 26 U/L (ref 15–41)
Albumin: 4.3 g/dL (ref 3.5–5.0)
Alkaline Phosphatase: 58 U/L (ref 38–126)
Anion gap: 7 (ref 5–15)
BUN: 10 mg/dL (ref 6–20)
CO2: 25 mmol/L (ref 22–32)
Calcium: 9.3 mg/dL (ref 8.9–10.3)
Chloride: 105 mmol/L (ref 98–111)
Creatinine, Ser: 0.73 mg/dL (ref 0.61–1.24)
GFR, Estimated: >60 mL/min (ref >60)
Glucose, Bld: 103 mg/dL — ABNORMAL HIGH (ref 70–99)
Potassium: 3.6 mmol/L (ref 3.5–5.1)
Sodium: 137 mmol/L (ref 135–145)
Total Bilirubin: 0.8 mg/dL (ref 0.3–1.2)
Total Protein: 7.7 g/dL (ref 6.5–8.1)

## 2021-07-17 LAB — CBC
HCT: 43.7 % (ref 39.0–52.0)
Hemoglobin: 15.2 g/dL (ref 13.0–17.0)
MCH: 30 pg (ref 26.0–34.0)
MCHC: 34.8 g/dL (ref 30.0–36.0)
MCV: 86.2 fL (ref 80.0–100.0)
Platelets: 270 10*3/uL (ref 150–400)
RBC: 5.07 MIL/uL (ref 4.22–5.81)
RDW: 12.8 % (ref 11.5–15.5)
WBC: 8 10*3/uL (ref 4.0–10.5)
nRBC: 0 % (ref 0.0–0.2)

## 2021-07-17 LAB — TROPONIN I (HIGH SENSITIVITY): Troponin I (High Sensitivity): <2 ng/L (ref <18)

## 2021-07-17 NOTE — ED Triage Notes (Signed)
Pt in with co syncopal episode today along with nausea. STates also feels numbness from left neck to left mid arm since 1200. Took wife's zoloft last night due to anxiety. PT has hx of herniated disc disease but no hx of neck problems.

## 2021-07-18 ENCOUNTER — Emergency Department
Admission: EM | Admit: 2021-07-18 | Discharge: 2021-07-18 | Disposition: A | Discharge status: Home / Self Care | Payer: Medicaid Other | Attending: Emergency Medicine | Admitting: Emergency Medicine

## 2021-07-18 DIAGNOSIS — R55 Syncope and collapse: Secondary | ICD-10-CM

## 2021-07-18 DIAGNOSIS — F419 Anxiety disorder, unspecified: Secondary | ICD-10-CM

## 2021-07-18 LAB — URINE DRUG SCREEN, QUALITATIVE (ARMC ONLY)
Amphetamines, Ur Screen: NOT DETECTED
Barbiturates, Ur Screen: NOT DETECTED
Benzodiazepine, Ur Scrn: NOT DETECTED
Cannabinoid 50 Ng, Ur ~~LOC~~: NOT DETECTED
Cocaine Metabolite,Ur ~~LOC~~: NOT DETECTED
MDMA (Ecstasy)Ur Screen: NOT DETECTED
Methadone Scn, Ur: NOT DETECTED
Opiate, Ur Screen: NOT DETECTED
Phencyclidine (PCP) Ur S: NOT DETECTED
Tricyclic, Ur Screen: NOT DETECTED

## 2021-07-18 LAB — TROPONIN I (HIGH SENSITIVITY): Troponin I (High Sensitivity): 3 ng/L (ref <18)

## 2021-07-18 MED ORDER — HYDROXYZINE HCL 25 MG PO TABS: 25.0000 mg | ORAL_TABLET | Freq: Three times a day (TID) | ORAL | 0 refills | Status: DC | PRN

## 2021-07-18 NOTE — Discharge Instructions (Addendum)
As we discussed, your evaluation was reassuring today.  It is very probable that your ongoing anxiety issues are the cause of your other symptoms as well.  We gave you another prescription for hydroxyzine but this is a temporary measure at best.  We recommend that you go to [facility] for one of their walk-in visits; it might take time to be seen, but it is important.  You can also call [facility] and try to schedule an appointment to establish a primary care doctor.  Return to the emergency department if you develop new or worsening symptoms that concern you.

## 2021-07-18 NOTE — ED Provider Notes (Signed)
[facility] Emergency Department Provider Note  ____________________________________________   Event Date/Time   First MD Initiated Contact with Patient 07/18/21 0532     (approximate)  I have reviewed the triage vital signs and the nursing notes.   HISTORY  Chief Complaint Dizziness and Loss of Consciousness    HPI Lee Sandoval is a 30 y.o. male with medical and psychiatric history as listed below who presents for evaluation of a syncopal episode.  He said he has been really struggling with anxiety over the last couple of months.  He has been seen here several times in the recent past.  He has been unable to follow-up with anyone because he tried going to [facility] but they said that he would have to wait to be seen rather than schedule an appointment.  He has been try to get into his family medicine doctor but they also stated have no appointments.  He thinks that might be a large part of what is going on.  He was given a prescription for hydroxyzine to use as needed by one of the ED physicians but he said he went through that within 2 weeks because he was using it 3 times a day.  He tried using one of his wife's Zoloft pills yesterday to see if that would help.  Today he reports that he went first thing in the morning with a friend of his to a job site.  He got out of the car and started feeling very lightheaded and dizzy.  He also was feeling a burning pain along his left shoulder and left upper arm and the back.  He thought the sensation would pass but then the next thing he knew he had passed out and was lying on the ground looking out that his friend.  The onset was acute and the symptoms were severe but he had no persistent issues.  He denies chest pain or shortness of breath, nausea, vomiting, abdominal pain, and dysuria.  He has no headache or neck pain.  He did not sustain any injuries from his syncopal episode.  Nothing in particular made it better or  worse.  He says that for a long time he was on Valium from his primary care doctor but then that primary care doctor moved away and he has not been on Valium since then.  He tried hydroxyzine and used up the prescription and thinks it helped a little bit.  He also was prescribed trazodone to help him sleep at night.     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 Authorizing Provider  amLODipine (NORVASC) 5 MG tablet Take 0.5 tablets (2.5 mg total) by mouth daily. 06/18/21   Sharman Cheek, MD  gabapentin (NEURONTIN) 100 MG capsule Take 1 capsule (100 mg total) by mouth 3 (three) times daily. 06/14/21   Charm Rings, NP  hydrOXYzine (ATARAX/VISTARIL) 25 MG tablet Take 1 tablet (25 mg total) by mouth 3 (three) times daily as needed for anxiety. 07/18/21   Loleta Rose, MD  traZODone (DESYREL) 50 MG tablet Take 1 tablet (50 mg total) by mouth at bedtime as needed for sleep. 06/14/21   Charm Rings, NP  pantoprazole (PROTONIX) 40  MG tablet Take 1 tablet (40 mg total) by mouth daily. 12/28/20 06/14/21  Gilles Chiquito, MD    Allergies Sulfa antibiotics and Tramadol hcl  Family History  Problem Relation Age of Onset   Heart failure Father        died at age 53   Depression Mother    Mental illness Mother     Social History Social History   Tobacco Use   Smoking status: Former    Packs/day: 1.00    Types: Cigarettes   Smokeless tobacco: Current  Vaping Use   Vaping Use: Some days  Substance Use Topics   Alcohol use: Not Currently    Alcohol/week: 0.0 standard drinks    Comment: occasionally    Drug use: Not Currently    Types: Marijuana    Comment: last smoked sunday morning    Review of Systems Constitutional: No fever/chills Eyes: No visual changes. ENT: No sore throat. Cardiovascular: Positive for syncopal episode x1.  Denies chest pain. Respiratory: Denies shortness of breath. Gastrointestinal: No abdominal pain.  No nausea, no vomiting.  No diarrhea.  No constipation. Genitourinary: Negative for dysuria. Musculoskeletal: Negative for neck pain.  Negative for back pain. Integumentary: Negative for rash. Neurological: Negative for headaches, focal weakness or numbness. Psych:  Positive for anxiety, persistent x 1-2 months   ____________________________________________   PHYSICAL EXAM:  VITAL SIGNS: ED Triage Vitals  Enc Vitals Group     BP 07/17/21 2238 (!) 151/86     Pulse Rate 07/17/21 2238 84     Resp 07/17/21 2238 20     Temp 07/17/21 2238 98.2 F (36.8 C)     Temp Source 07/17/21 2238 Oral     SpO2 07/17/21 2238 98 %     Weight 07/17/21 2239 98.4 kg (217 lb)     Height 07/17/21 2239 1.88 m (6\' 2" )     Head Circumference --      Peak Flow --      Pain Score 07/17/21 2238 6     Pain Loc --      Pain Edu? --      Excl. in GC? --     Constitutional: Alert and oriented.  Eyes: Conjunctivae are normal.  Head: Atraumatic. Nose: No congestion/rhinnorhea. Mouth/Throat:  Patient is wearing a mask. Neck: No stridor.  No meningeal signs.   Cardiovascular: Normal rate, regular rhythm. Good peripheral circulation. Respiratory: Normal respiratory effort.  No retractions. Gastrointestinal: Soft and nontender. No distention.  Musculoskeletal: No lower extremity tenderness nor edema. No gross deformities of extremities. Neurologic:  Normal speech and language. No gross focal neurologic deficits are appreciated.  Good grip and major muscle groups strength throughout upper and lower extremities. Skin:  Skin is warm, dry and intact. Psychiatric: Mood and affect are normal. Speech and behavior are normal.  He seems to  have good insight into his anxiety issues.  He is not endorsing suicidal ideation or homicidal ideation.  ____________________________________________   LABS (all labs ordered are listed, but only abnormal results are displayed)  Labs Reviewed  COMPREHENSIVE METABOLIC PANEL - Abnormal; Notable for the following components:      Result Value   Glucose, Bld 103 (*)    ALT 46 (*)    All other components within normal limits  CBC  URINE DRUG SCREEN, QUALITATIVE ([facility] ONLY)  TROPONIN I (HIGH SENSITIVITY)  TROPONIN I (HIGH SENSITIVITY)   ____________________________________________  EKG  ED ECG REPORT I, Loleta Rose, the attending physician, personally viewed and interpreted this ECG.  Date: 07/17/2021 EKG Time: 22: 41 Rate: 75 Rhythm: normal sinus rhythm QRS Axis: normal Intervals: normal ST/T Wave abnormalities: normal Narrative Interpretation: no evidence of acute ischemia  ____________________________________________   INITIAL IMPRESSION / MDM / ASSESSMENT AND PLAN / ED COURSE  As part of my medical decision making, I reviewed the following data within the electronic MEDICAL RECORD NUMBER Nursing notes reviewed and incorporated, Labs reviewed , EKG interpreted , Old chart reviewed, and Notes from prior ED visits   Differential diagnosis  includes, but is not limited to, anxiety/panic attacks, medication or drug side effect, dehydration, cardiogenic syncope, electrolyte or metabolic abnormality, intracranial bleed, CVA.  Patient is well-appearing and in no distress with stable vital signs.  Nonischemic EKG.  Lab work is all reassuring including a CBC and comprehensive metabolic panel as well as negative troponin x2.  He has a normal physical exam, no focal neurological deficits, no sign or symptoms of infection.  Given the patient's history and recent struggles with anxiety, I think that is most likely the primary issue with which she is dealing.  I explained to him that he really needs to follow-up as an outpatient, even if it means waiting to be seen at [facility].  I also gave him follow-up information with [facility] clinic to try to establish a primary care doctor.  I gave him another prescription for hydroxyzine but stressed that this is only temporary solution and he needs to work with a primary care or mental health provider as an outpatient for longer term treatment.  He understands and agrees with the plan.  There is no indication of an emergent condition at this time and he is cleared for discharge and outpatient follow-up.  I gave my usual and customary return precautions.             ____________________________________________  FINAL CLINICAL IMPRESSION(S) / ED DIAGNOSES  Final diagnoses:  Anxiety  Syncope and collapse     MEDICATIONS GIVEN DURING THIS VISIT:  Medications - No data to display   ED Discharge Orders          Ordered    hydrOXYzine (ATARAX/VISTARIL) 25 MG tablet  3 times daily PRN        07/18/21 0646             Note:  This document was prepared using Dragon voice recognition software and may include unintentional dictation errors.   Loleta Rose, MD 07/18/21 [phone]

## 2021-08-05 ENCOUNTER — Other Ambulatory Visit (HOSPITAL_COMMUNITY): Payer: Self-pay | Admitting: Psychiatry

## 2021-08-10 NOTE — Telephone Encounter (Signed)
Client seen in the ED 

## 2021-12-30 IMAGING — CT CT HEAD W/O CM
3 series · 15 of 47 positions shown, 18 images · non-contrast
Comparison: 05/17/2020

CLINICAL DATA: Dizziness and headache.  Classic migraine

EXAM:
CT HEAD WITHOUT CONTRAST
TECHNIQUE: Contiguous axial images were obtained from the base of the skull
through the vertex without intravenous contrast.

[Series 2: head wo · axial · 0.43mm/px · z∈[-100,+40]mm · 9 of 34 slices shown, 12 images]
[im 3/34  brain]
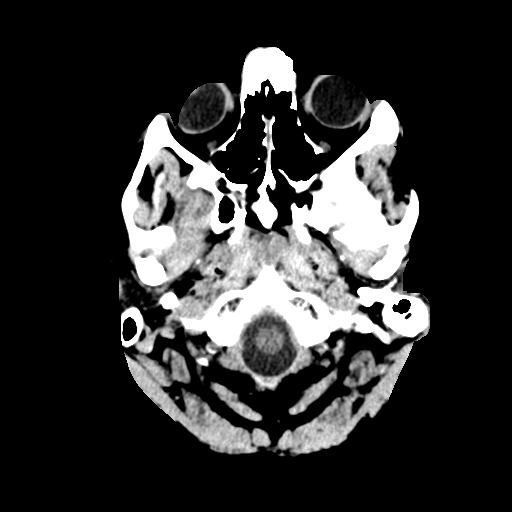
[im 3/34  bone]
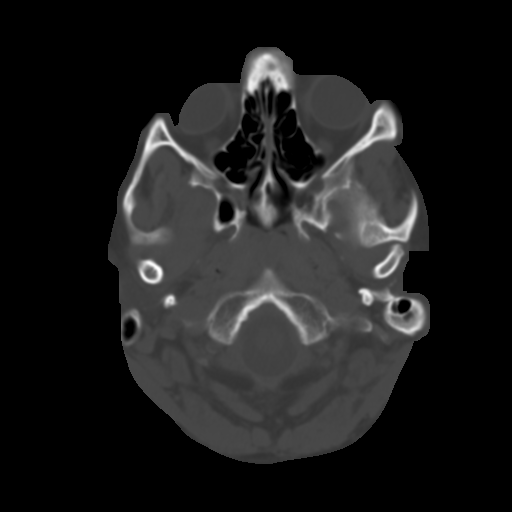
[im 6/34  brain]
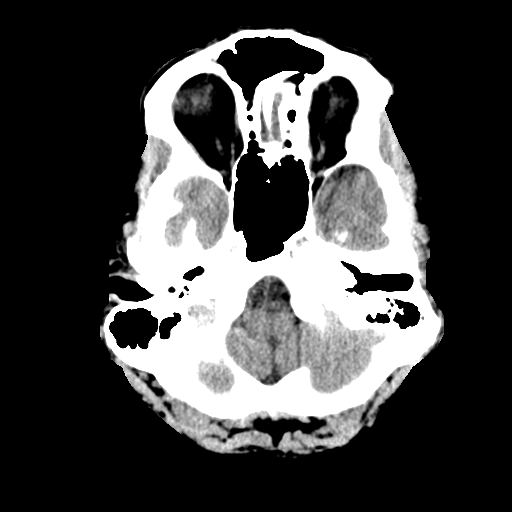
[im 10/34  brain]
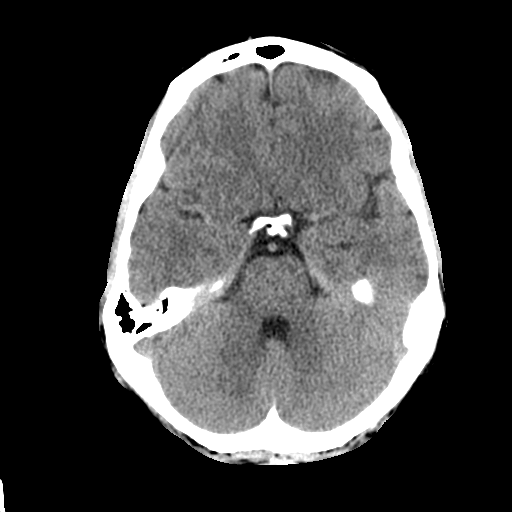
[im 13/34  brain]
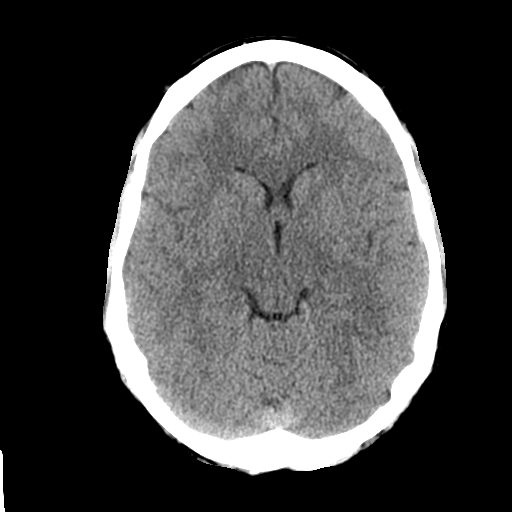
[im 18/34  brain]
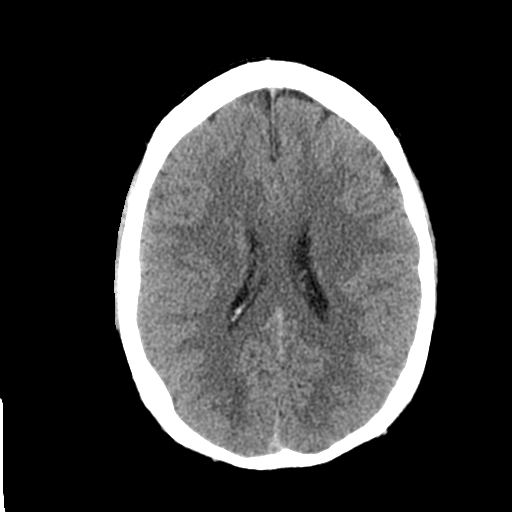
[im 18/34  bone]
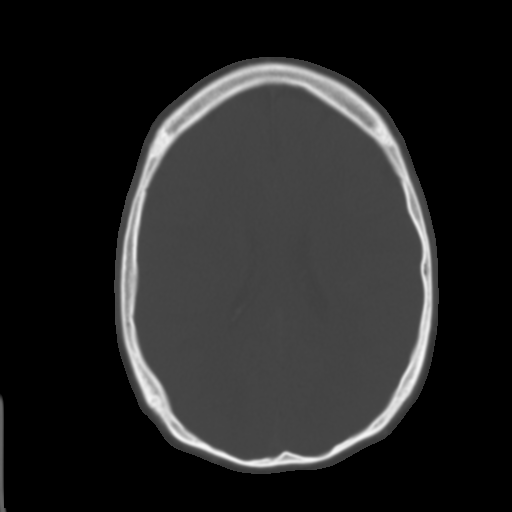
[im 21/34  brain]
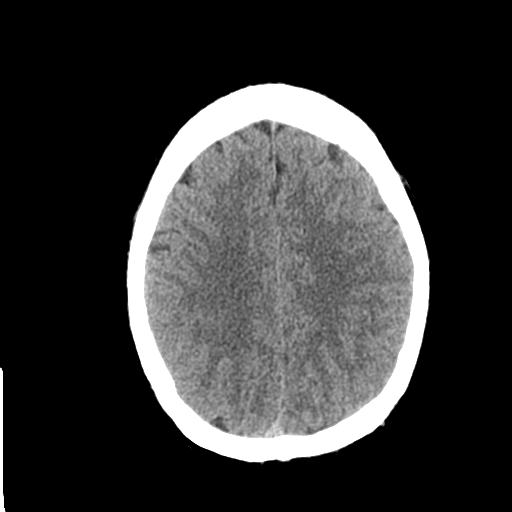
[im 24/34  brain]
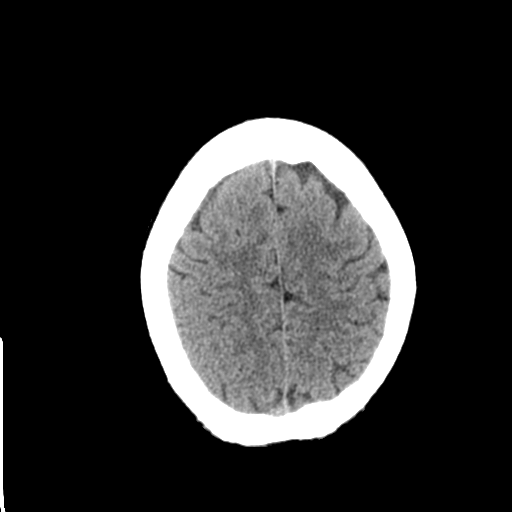
[im 28/34  brain]
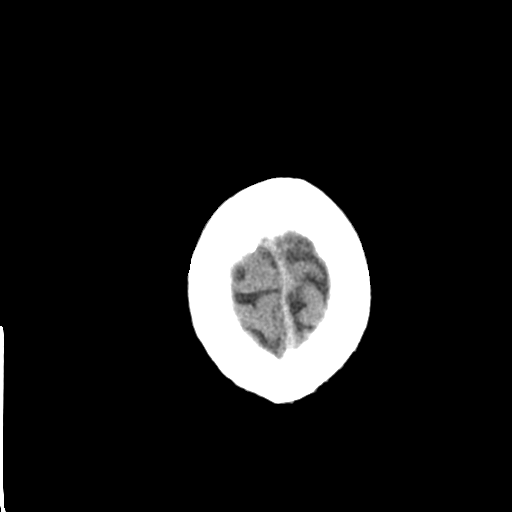
[im 31/34  brain]
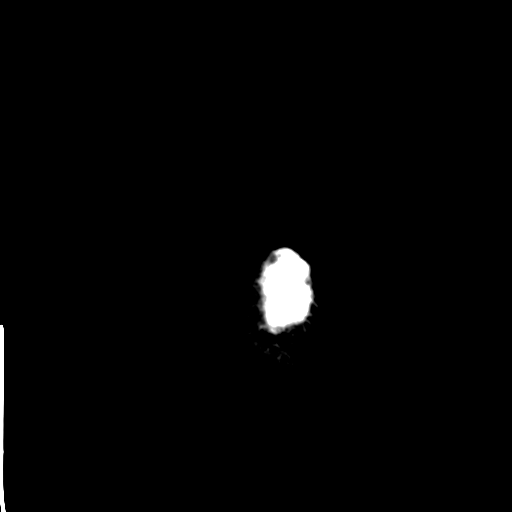
[im 31/34  bone]
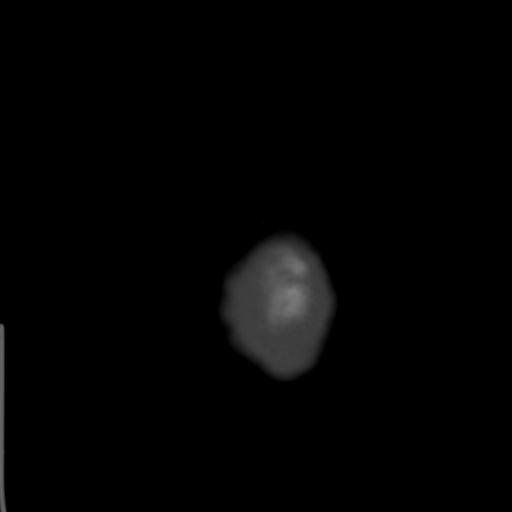

[Series 4: coronal soft tissue · coronal · 0.37mm/px · 3 of 73 slices shown]
[im 25/73  brain]
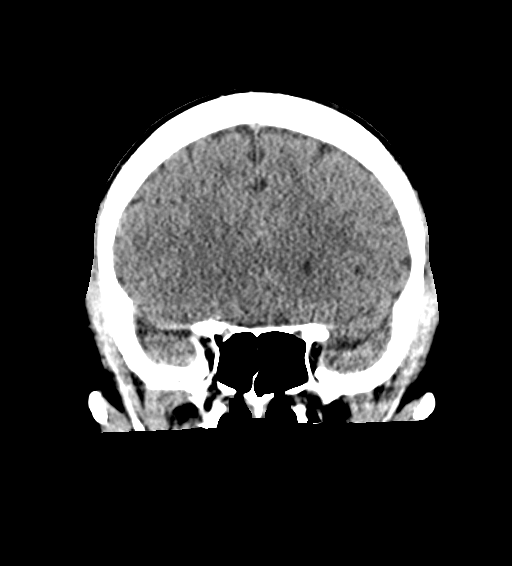
[im 33/73  brain]
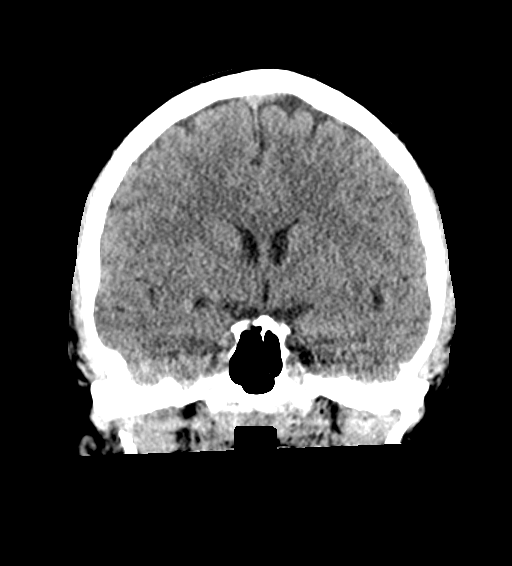
[im 41/73  brain]
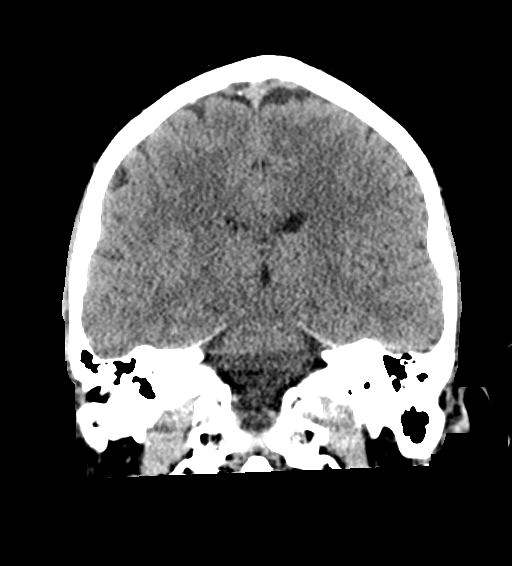

[Series 5: sagittal soft tissue · sagittal · 0.34mm/px · 3 of 59 slices shown]
[im 20/59  brain]
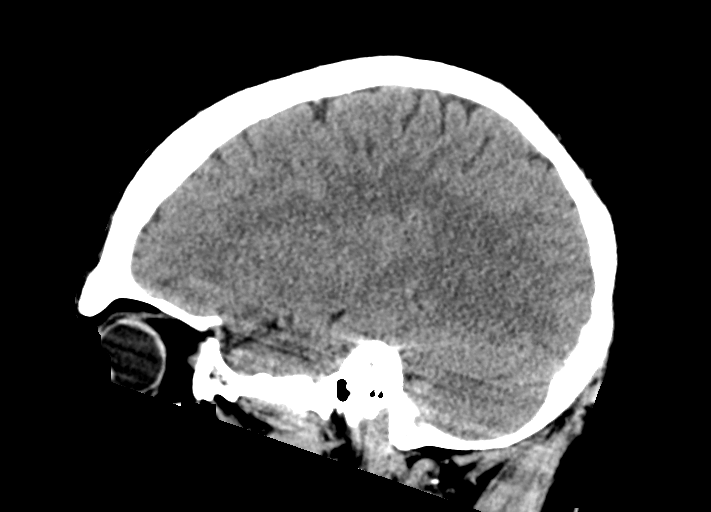
[im 30/59  brain]
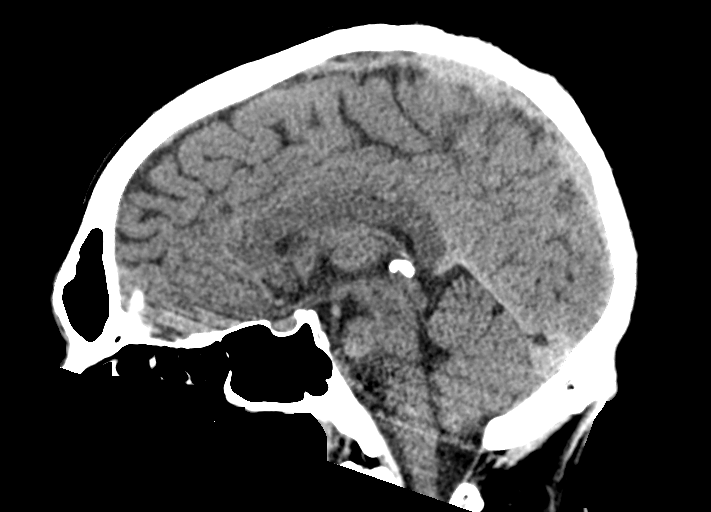
[im 39/59  brain]
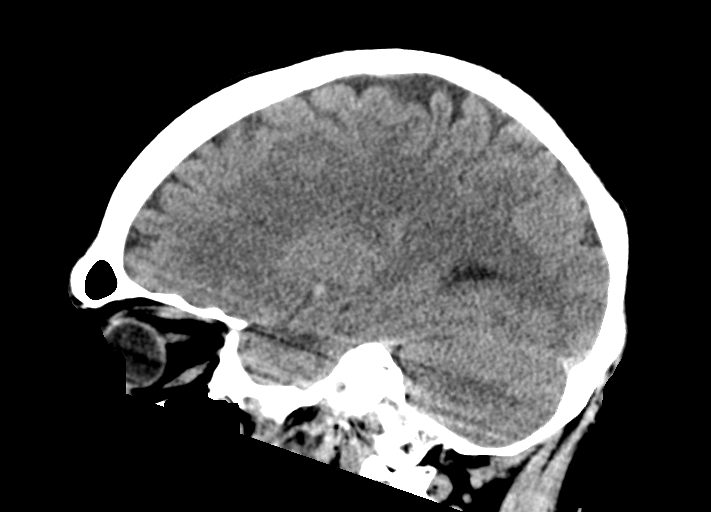

[15 of 47 positions shown; findings below may reference images not displayed]

FINDINGS: Brain: No evidence of acute infarction, hemorrhage, hydrocephalus,
extra-axial collection or mass lesion/mass effect.

Vascular: No hyperdense vessel or unexpected calcification.

Skull: Normal. Negative for fracture or focal lesion.

Sinuses/Orbits: No acute finding.
IMPRESSION: Negative head CT.

## 2022-01-09 ENCOUNTER — Other Ambulatory Visit: Payer: Self-pay

## 2022-01-09 ENCOUNTER — Emergency Department
Admission: EM | Admit: 2022-01-09 | Discharge: 2022-01-09 | Disposition: A | Discharge status: Home / Self Care | Payer: Medicaid Other | Attending: Emergency Medicine | Admitting: Emergency Medicine

## 2022-01-09 ENCOUNTER — Encounter (HOSPITAL_COMMUNITY): Payer: Self-pay

## 2022-01-09 ENCOUNTER — Emergency Department (HOSPITAL_COMMUNITY)
Admission: EM | Admit: 2022-01-09 | Discharge: 2022-01-09 | Discharge status: Against Medical Advice | Payer: Medicaid Other | Attending: Emergency Medicine | Admitting: Emergency Medicine

## 2022-01-09 DIAGNOSIS — I10 Essential (primary) hypertension: Secondary | ICD-10-CM

## 2022-01-09 DIAGNOSIS — Z5321 Procedure and treatment not carried out due to patient leaving prior to being seen by health care provider: Secondary | ICD-10-CM | POA: Insufficient documentation

## 2022-01-09 DIAGNOSIS — F17210 Nicotine dependence, cigarettes, uncomplicated: Secondary | ICD-10-CM | POA: Insufficient documentation

## 2022-01-09 DIAGNOSIS — M5412 Radiculopathy, cervical region: Secondary | ICD-10-CM | POA: Insufficient documentation

## 2022-01-09 HISTORY — DX: Essential (primary) hypertension: I10

## 2022-01-09 MED ORDER — AMLODIPINE BESYLATE 5 MG PO TABS: 2.5000 mg | ORAL_TABLET | Freq: Every day | ORAL | 1 refills | Status: DC

## 2022-01-09 NOTE — ED Provider Notes (Signed)
[facility] Provider Note    Event Date/Time   First MD Initiated Contact with Patient 01/09/22 2256     (approximate)   History   Hypertension   HPI  Lee Sandoval is a 31 y.o. male past medical history of generalized anxiety disorder ADHD and hypertension who presents with elevated blood pressure reading.  Patient notes that over the last 3 days he has intermittently been checking his blood pressure and is ranged from the 140s to 170s.  He notes that he intermittently feels a tight sensation in the left side of his face and pain in the left neck and shoulder with some decrease sensation down his arm.  He was feeling this earlier tonight he checked his blood pressure and it was 123XX123 systolic which was the highest its been for him.  He denies chest pain shortness of breath.  Denies headache.  Denies any numbness or tingling in his lower extremities.  Patient notes that he was previously taking 2.5 mg of amlodipine which was prescribed for hypertension but he stopped taking it several months ago.  He currently does not have health insurance.  He does not have a primary care provider.  He has had this tingling sensation in the left arm chronically and has been told he needed to see a neurologist but has not done so yet.    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 --      Excl. in [place]? --     Most recent vital signs: Vitals:   01/09/22 2236  BP: (!) 155/89  Pulse: 90  Resp: 16  Temp: 98.5 F (36.9 C)  SpO2: 98%     General: Awake, no distress.  CV:  Good peripheral perfusion.  Resp:  Normal effort.  Abd:  No distention.  Neuro:             Awake, Alert, Oriented x 3  Other:  Aox3, nml speech  PERRL, EOMI, face symmetric, nml tongue movement  5/5 strength in the BL upper and lower extremities  Subjective decreased sensation in the L upper extremity compared to  right  Finger-nose-finger intact BL    ED Results / Procedures / Treatments  Labs (all labs ordered are listed, but only abnormal results are displayed) Labs Reviewed - No data to display   EKG  EKG interpretation performed by myself: NSR, nml axis, nml intervals, no acute ischemic changes    RADIOLOGY    PROCEDURES:  Critical Care performed: No  MEDICATIONS ORDERED IN ED: Medications - No data to display   IMPRESSION / MDM / [place] / ED COURSE  I reviewed the triage vital signs and the nursing notes.                              Differential diagnosis includes, but is not limited to, anxiety, asymptomatic hypertension, cervical radiculopathy  The patient is a 31 year old male with a history of hypertension anxiety presents with primary concern for elevated blood pressure at home.  Was previously taking amlodipine at a low dose but stopped  taking this.  He has been checking his blood pressure frequently over the past 3 days mainly because he has been having some pain in the left shoulder and neck.  Tonight it was 123XX123 systolic which prompted him to come to the emergency department.  Patient scribes and a feeling of tightness in the left side of his face and then pain in the left shoulder blade and neck with a decrease sensation in the left arm.  Denies chest pain or dyspnea.  Patient tells me he has had this in the past has been told he needed to see a neurologist.  I reviewed prior ED visits and back in March 2022 he had very similar symptoms and had a CT of his head that was negative at that time.  This is reassuring to me and I do not feel that we need to repeat the imaging.  Sounds most like a cervical radiculopathy.  He has no objective strength deficit on exam today just decreased subjective sensation to light touch in the left arm the rest of his neurologic exam is intact.  Blood pressure is mildly elevated.  I reviewed his EKG which is no ischemic changes.  I suspect that this is asymptomatic hypertension with concomitant anxiety versus cervical radiculopathy.  I advised that he check his blood pressure just once per day and keep a 2-week recording of this and follow-up at one of the clinics that we will see patients are uninsured.  Will prescribe him the amlodipine again.  Did not think any further work-up is necessary at this time given the chronicity of symptoms.     FINAL CLINICAL IMPRESSION(S) / ED DIAGNOSES   Final diagnoses:  Hypertension, unspecified type  Cervical radiculopathy     Rx / DC Orders   ED Discharge Orders          Ordered    amLODipine (NORVASC) 5 MG tablet  Daily        01/09/22 2320             Note:  This document was prepared using Dragon voice recognition software and may include unintentional dictation errors.   Rada Hay, MD 01/09/22 [id number]

## 2022-01-09 NOTE — ED Notes (Signed)
First encounter with pt prior to discharge. Pt verbalized understanding of discharge instructions, prescriptions, and follow-up care instructions. Pt advised if symptoms worsen to return to ED. E-signature not available at this time.

## 2022-01-09 NOTE — ED Triage Notes (Signed)
Patient reports that he was taking amlodipine and has taken x months. Patient reports that his BP this AM was 150/90 and states that he has pressure on the left side of his face that radiated into his left shoulder and left arm.   BP in triage-138/95.

## 2022-01-09 NOTE — Discharge Instructions (Signed)
Please limit your blood pressure checks to once daily.  Please keep a record of this over the next 2 weeks.  I have also represcribed your amlodipine, you can take half a pill daily.  Please follow-up at one of the clinics above.  Suspect that your neck pain and numbness in your arm is related to a pinched nerve in your neck.  I reviewed the records and you have had CAT scans of your head in the past which were normal.

## 2022-01-09 NOTE — ED Triage Notes (Signed)
Pt presents to ER from home.  Pt states he took his BP today and it was 170/106.  Pt states he was taking his rx amlodipine and his BP had been doing great and then pt stopped taking it and he has noticed his BP slowly going up since he stopped it.  Pt c/o some left shoulder/arm pain and pain to left side of his face.  Pt A&O x4 at this time.  NAD noted.

## 2022-02-12 ENCOUNTER — Other Ambulatory Visit: Payer: Self-pay

## 2022-02-12 ENCOUNTER — Emergency Department
Admission: EM | Admit: 2022-02-12 | Discharge: 2022-02-12 | Disposition: A | Discharge status: Other Acute Inpatient Hospital | Payer: Self-pay | Attending: Emergency Medicine | Admitting: Emergency Medicine

## 2022-02-12 ENCOUNTER — Encounter: Payer: Self-pay | Admitting: Psychiatry

## 2022-02-12 ENCOUNTER — Inpatient Hospital Stay
Admission: AD | Admit: 2022-02-12 | Discharge: 2022-02-14 | DRG: 881 | Disposition: A | Discharge status: Home / Self Care | Payer: 59 | Source: Intra-hospital | Attending: Psychiatry | Admitting: Psychiatry

## 2022-02-12 DIAGNOSIS — Z79899 Other long term (current) drug therapy: Secondary | ICD-10-CM | POA: Insufficient documentation

## 2022-02-12 DIAGNOSIS — Z882 Allergy status to sulfonamides status: Secondary | ICD-10-CM

## 2022-02-12 DIAGNOSIS — Z818 Family history of other mental and behavioral disorders: Secondary | ICD-10-CM

## 2022-02-12 DIAGNOSIS — F411 Generalized anxiety disorder: Secondary | ICD-10-CM | POA: Diagnosis present

## 2022-02-12 DIAGNOSIS — I10 Essential (primary) hypertension: Secondary | ICD-10-CM | POA: Diagnosis present

## 2022-02-12 DIAGNOSIS — F410 Panic disorder [episodic paroxysmal anxiety] without agoraphobia: Secondary | ICD-10-CM

## 2022-02-12 DIAGNOSIS — R45851 Suicidal ideations: Secondary | ICD-10-CM

## 2022-02-12 DIAGNOSIS — F321 Major depressive disorder, single episode, moderate: Secondary | ICD-10-CM

## 2022-02-12 DIAGNOSIS — K219 Gastro-esophageal reflux disease without esophagitis: Secondary | ICD-10-CM | POA: Diagnosis present

## 2022-02-12 DIAGNOSIS — Z885 Allergy status to narcotic agent status: Secondary | ICD-10-CM

## 2022-02-12 DIAGNOSIS — Z20822 Contact with and (suspected) exposure to covid-19: Secondary | ICD-10-CM | POA: Insufficient documentation

## 2022-02-12 DIAGNOSIS — Y900 Blood alcohol level of less than 20 mg/100 ml: Secondary | ICD-10-CM | POA: Insufficient documentation

## 2022-02-12 DIAGNOSIS — F17200 Nicotine dependence, unspecified, uncomplicated: Secondary | ICD-10-CM | POA: Diagnosis present

## 2022-02-12 DIAGNOSIS — F329 Major depressive disorder, single episode, unspecified: Secondary | ICD-10-CM | POA: Diagnosis present

## 2022-02-12 DIAGNOSIS — F902 Attention-deficit hyperactivity disorder, combined type: Secondary | ICD-10-CM | POA: Diagnosis present

## 2022-02-12 DIAGNOSIS — Z87891 Personal history of nicotine dependence: Secondary | ICD-10-CM | POA: Insufficient documentation

## 2022-02-12 DIAGNOSIS — F32A Depression, unspecified: Secondary | ICD-10-CM | POA: Diagnosis present

## 2022-02-12 DIAGNOSIS — F332 Major depressive disorder, recurrent severe without psychotic features: Secondary | ICD-10-CM | POA: Diagnosis not present

## 2022-02-12 DIAGNOSIS — F339 Major depressive disorder, recurrent, unspecified: Secondary | ICD-10-CM | POA: Insufficient documentation

## 2022-02-12 DIAGNOSIS — F419 Anxiety disorder, unspecified: Secondary | ICD-10-CM

## 2022-02-12 HISTORY — DX: Gastro-esophageal reflux disease without esophagitis: K21.9

## 2022-02-12 LAB — URINE DRUG SCREEN, QUALITATIVE (ARMC ONLY)
Amphetamines, Ur Screen: NOT DETECTED
Barbiturates, Ur Screen: NOT DETECTED
Benzodiazepine, Ur Scrn: POSITIVE — AB
Cannabinoid 50 Ng, Ur ~~LOC~~: NOT DETECTED
Cocaine Metabolite,Ur ~~LOC~~: NOT DETECTED
MDMA (Ecstasy)Ur Screen: NOT DETECTED
Methadone Scn, Ur: NOT DETECTED
Opiate, Ur Screen: NOT DETECTED
Phencyclidine (PCP) Ur S: NOT DETECTED
Tricyclic, Ur Screen: NOT DETECTED

## 2022-02-12 LAB — RESP PANEL BY RT-PCR (FLU A&B, COVID) ARPGX2
Influenza A by PCR: NEGATIVE
Influenza B by PCR: NEGATIVE
SARS Coronavirus 2 by RT PCR: NEGATIVE

## 2022-02-12 LAB — CBC
HCT: 42.3 % (ref 39.0–52.0)
Hemoglobin: 13.9 g/dL (ref 13.0–17.0)
MCH: 28.5 pg (ref 26.0–34.0)
MCHC: 32.9 g/dL (ref 30.0–36.0)
MCV: 86.7 fL (ref 80.0–100.0)
Platelets: 267 10*3/uL (ref 150–400)
RBC: 4.88 MIL/uL (ref 4.22–5.81)
RDW: 12.5 % (ref 11.5–15.5)
WBC: 4.9 10*3/uL (ref 4.0–10.5)
nRBC: 0 % (ref 0.0–0.2)

## 2022-02-12 LAB — COMPREHENSIVE METABOLIC PANEL
ALT: 34 U/L (ref 0–44)
AST: 24 U/L (ref 15–41)
Albumin: 4.2 g/dL (ref 3.5–5.0)
Alkaline Phosphatase: 48 U/L (ref 38–126)
Anion gap: 6 (ref 5–15)
BUN: 13 mg/dL (ref 6–20)
CO2: 27 mmol/L (ref 22–32)
Calcium: 9.1 mg/dL (ref 8.9–10.3)
Chloride: 107 mmol/L (ref 98–111)
Creatinine, Ser: 0.79 mg/dL (ref 0.61–1.24)
GFR, Estimated: >60 mL/min (ref >60)
Glucose, Bld: 86 mg/dL (ref 70–99)
Potassium: 3.7 mmol/L (ref 3.5–5.1)
Sodium: 140 mmol/L (ref 135–145)
Total Bilirubin: 0.5 mg/dL (ref 0.3–1.2)
Total Protein: 7.2 g/dL (ref 6.5–8.1)

## 2022-02-12 LAB — ETHANOL: Alcohol, Ethyl (B): <10 mg/dL (ref <10)

## 2022-02-12 MED ORDER — NICOTINE 14 MG/24HR TD PT24
14.0000 mg | MEDICATED_PATCH | Freq: Every day | TRANSDERMAL | Status: DC
Start: 2022-02-13 — End: 2022-02-14
  Administered 2022-02-13 – 2022-02-14 (×2): 14 mg via TRANSDERMAL
  Filled 2022-02-12 (×2): qty 1

## 2022-02-12 MED ORDER — ALUM & MAG HYDROXIDE-SIMETH 200-200-20 MG/5ML PO SUSP: 30.0000 mL | ORAL | Status: DC | PRN

## 2022-02-12 MED ORDER — MAGNESIUM HYDROXIDE 400 MG/5ML PO SUSP: 30.0000 mL | Freq: Every day | ORAL | Status: DC | PRN

## 2022-02-12 MED ORDER — MELATONIN 5 MG PO TABS
10.0000 mg | ORAL_TABLET | Freq: Every evening | ORAL | Status: DC | PRN
  Administered 2022-02-12: 10 mg via ORAL
  Filled 2022-02-12: qty 2

## 2022-02-12 MED ORDER — ACETAMINOPHEN 325 MG PO TABS: 650.0000 mg | ORAL_TABLET | Freq: Four times a day (QID) | ORAL | Status: DC | PRN

## 2022-02-12 NOTE — ED Notes (Signed)
Pt. Transferred to BHU , room# 1 from main ED .Patient was screened by security before entering the unit. Report to include Situation, Background, Assessment and Recommendations from ED RN . Pt. Oriented to unit including Q15 minute rounds as well as the security cameras for their protection. Patient is alert and oriented, warm and dry in no acute distress.  ? ?

## 2022-02-12 NOTE — ED Notes (Signed)
TTS and Provider @ bedside ?
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
## 2022-02-12 NOTE — ED Notes (Signed)
Hospital sandwich tray, pt tolerated w/o complaints.  Waste discarded appropriately.  ?

## 2022-02-12 NOTE — ED Notes (Addendum)
Staff continues to collect urine.  Pt supplied water x2 cups.  Refuses to give sample ?

## 2022-02-12 NOTE — Consult Note (Cosign Needed Addendum)
[facility] Consult   Reason for Consult:  "suicidal, anxious, and depressed" Referring Physician:  Cinda Quest Patient Identification: Lee Sandoval MRN:  [id number] Principal Diagnosis: Clinical depression Diagnosis:  Principal Problem:   Clinical depression   Total Time spent with patient: 45 minutes  Subjective:   Lee Sandoval is a 31 y.o. male patient admitted with "I'm here with depression, unhappiness with life, suicidal thoughts."  HPI:  Patient seen and chart reviewed. Patient states he wants to be admitted for evaluation for above stated problems. He describes long-standing anhedonia, taking interest in "nothing." Is not working due to back problems and trying to get on disability.  Patient states he lives with his wife and 3 children and has other responsibilities with his grandparents.  He says he has no intent of suicide and has not formulated a plan but he has constant thoughts of not wanting to be alive.  He describes very low energy and interest in nothing.  Denies homicidal thoughts, auditory or visual hallucinations or paranoia.  He states that sometimes he has crying fits and "manic episodes" but fails to provide an accurate account of what that looks like.  He states that panic attacks are chest pain and fight-or-flight response,  difficulty breathing.  He has been in the ED before and gotten hydroxyzine for anxiety that he says does not really help.  His aunt gave him a Xanax and this morning that helped calm the anxiety but did not help with his depression.  Patient denies illicit drug use. States only drinks "socially every couple 2 months  Past Psychiatric History: States he has had long-term anxiety.  He was hospitalized psychiatrically at age 89.  He states that they tried "a bunch of antidepressants" and nothing helped.  Risk to Self:   Risk to Others:   Prior Inpatient Therapy:   Prior Outpatient Therapy:    Past Medical History:  Past  Medical History:  Diagnosis Date   Attention deficit hyperactivity disorder (ADHD), combined type, mild    Generalized anxiety disorder    Hypertension     Past Surgical History:  Procedure Laterality Date   LAPAROSCOPIC APPENDECTOMY N/A 08/09/2019   Procedure: APPENDECTOMY LAPAROSCOPIC;  Surgeon: Herbert Pun, MD;  Location: [facility] ORS;  Service: General;  Laterality: N/A;   TONSILLECTOMY     TOOTH EXTRACTION     Family History:  Family History  Problem Relation Age of Onset   Heart failure Father        died at age 3   Depression Mother    Mental illness Mother    Family Psychiatric  History: unknown Social History:  Social History   Substance and Sexual Activity  Alcohol Use Yes   Comment: occasionally      Social History   Substance and Sexual Activity  Drug Use Not Currently   Types: Marijuana    Social History   Socioeconomic History   Marital status: Married    Spouse name: Not on file   Number of children: 2   Years of education: Not on file   Highest education level: Not on file  Occupational History   Occupation: Gaffer  Tobacco Use   Smoking status: Former    Packs/day: 1.00    Types: Cigarettes   Smokeless tobacco: Current  Vaping Use   Vaping Use: Some days   Substances: Nicotine, Flavoring  Substance and Sexual Activity   Alcohol use: Yes    Comment: occasionally    Drug  use: Not Currently    Types: Marijuana   Sexual activity: Yes    Partners: Female  Other Topics Concern   Not on file  Social History Narrative   Not on file   Social Determinants of Health   Financial Resource Strain: Not on file  Food Insecurity: Not on file  Transportation Needs: Not on file  Physical Activity: Not on file  Stress: Not on file  Social Connections: Not on file   Additional Social History:    Allergies:   Allergies  Allergen Reactions   Sulfa Antibiotics Hives    High fever.    Tramadol Hcl Other (See Comments)     Reaction: unknown    Labs:  Results for orders placed or performed during the hospital encounter of 02/12/22 (from the past 48 hour(s))  Comprehensive metabolic panel     Status: None   Collection Time: 02/12/22 10:56 AM  Result Value Ref Range   Sodium 140 135 - 145 mmol/L   Potassium 3.7 3.5 - 5.1 mmol/L   Chloride 107 98 - 111 mmol/L   CO2 27 22 - 32 mmol/L   Glucose, Bld 86 70 - 99 mg/dL    Comment: Glucose reference range applies only to samples taken after fasting for at least 8 hours.   BUN 13 6 - 20 mg/dL   Creatinine, Ser 0.79 0.61 - 1.24 mg/dL   Calcium 9.1 8.9 - 10.3 mg/dL   Total Protein 7.2 6.5 - 8.1 g/dL   Albumin 4.2 3.5 - 5.0 g/dL   AST 24 15 - 41 U/L   ALT 34 0 - 44 U/L   Alkaline Phosphatase 48 38 - 126 U/L   Total Bilirubin 0.5 0.3 - 1.2 mg/dL   GFR, Estimated >60 >60 mL/min    Comment: (NOTE) Calculated using the CKD-EPI Creatinine Equation (2021)    Anion gap 6 5 - 15    Comment: Performed at [facility], [address] [place], [place] [address]  Ethanol     Status: None   Collection Time: 02/12/22 10:56 AM  Result Value Ref Range   Alcohol, Ethyl (B) <10 <10 mg/dL    Comment: (NOTE) Lowest detectable limit for serum alcohol is 10 mg/dL.  For medical purposes only. Performed at [facility], [address] [place], [place] [address]   cbc     Status: None   Collection Time: 02/12/22 10:56 AM  Result Value Ref Range   WBC 4.9 4.0 - 10.5 K/uL   RBC 4.88 4.22 - 5.81 MIL/uL   Hemoglobin 13.9 13.0 - 17.0 g/dL   HCT 42.3 39.0 - 52.0 %   MCV 86.7 80.0 - 100.0 fL   MCH 28.5 26.0 - 34.0 pg   MCHC 32.9 30.0 - 36.0 g/dL   RDW 12.5 11.5 - 15.5 %   Platelets 267 150 - 400 K/uL   nRBC 0.0 0.0 - 0.2 %    Comment: Performed at [facility], [address] [place], [place] [address]  Resp Panel by RT-PCR (Flu A&B, Covid) Nasopharyngeal Swab     Status: None   Collection Time: 02/12/22 12:16 PM   Specimen: Nasopharyngeal  Swab; Nasopharyngeal(NP) swabs in vial transport medium  Result Value Ref Range   SARS Coronavirus 2 by RT PCR NEGATIVE NEGATIVE    Comment: (NOTE) SARS-CoV-2 target nucleic acids are NOT DETECTED.  The SARS-CoV-2 RNA is generally detectable in upper respiratory specimens during the acute phase of infection. The lowest concentration  of SARS-CoV-2 viral copies this assay can detect is 138 copies/mL. A negative result does not preclude SARS-Cov-2 infection and should not be used as the sole basis for treatment or other patient management decisions. A negative result may occur with  improper specimen collection/handling, submission of specimen other than nasopharyngeal swab, presence of viral mutation(s) within the areas targeted by this assay, and inadequate number of viral copies(<138 copies/mL). A negative result must be combined with clinical observations, patient history, and epidemiological information. The expected result is Negative.  Fact Sheet for Patients:  EntrepreneurPulse.com.au  Fact Sheet for Healthcare Providers:  IncredibleEmployment.be  This test is no t yet approved or cleared by the [place] FDA and  has been authorized for detection and/or diagnosis of SARS-CoV-2 by FDA under an Emergency Use Authorization (EUA). This EUA will remain  in effect (meaning this test can be used) for the duration of the COVID-19 declaration under Section 564(b)(1) of the Act, 21 U.S.C.section 360bbb-3(b)(1), unless the authorization is terminated  or revoked sooner.       Influenza A by PCR NEGATIVE NEGATIVE   Influenza B by PCR NEGATIVE NEGATIVE    Comment: (NOTE) The Xpert Xpress SARS-CoV-2/FLU/RSV plus assay is intended as an aid in the diagnosis of influenza from Nasopharyngeal swab specimens and should not be used as a sole basis for treatment. Nasal washings and aspirates are unacceptable for Xpert Xpress  SARS-CoV-2/FLU/RSV testing.  Fact Sheet for Patients: EntrepreneurPulse.com.au  Fact Sheet for Healthcare Providers: IncredibleEmployment.be  This test is not yet approved or cleared by the [place] FDA and has been authorized for detection and/or diagnosis of SARS-CoV-2 by FDA under an Emergency Use Authorization (EUA). This EUA will remain in effect (meaning this test can be used) for the duration of the COVID-19 declaration under Section 564(b)(1) of the Act, 21 U.S.C. section 360bbb-3(b)(1), unless the authorization is terminated or revoked.  Performed at [facility], [address] [place], [place] [address]   Urine Drug Screen, Qualitative     Status: Abnormal   Collection Time: 02/12/22  1:50 PM  Result Value Ref Range   Tricyclic, Ur Screen NONE DETECTED NONE DETECTED   Amphetamines, Ur Screen NONE DETECTED NONE DETECTED   MDMA (Ecstasy)Ur Screen NONE DETECTED NONE DETECTED   Cocaine Metabolite,Ur [place] NONE DETECTED NONE DETECTED   Opiate, Ur Screen NONE DETECTED NONE DETECTED   Phencyclidine (PCP) Ur S NONE DETECTED NONE DETECTED   Cannabinoid 50 Ng, Ur [place] NONE DETECTED NONE DETECTED   Barbiturates, Ur Screen NONE DETECTED NONE DETECTED   Benzodiazepine, Ur Scrn POSITIVE (A) NONE DETECTED   Methadone Scn, Ur NONE DETECTED NONE DETECTED    Comment: (NOTE) Tricyclics + metabolites, urine    Cutoff 1000 ng/mL Amphetamines + metabolites, urine  Cutoff 1000 ng/mL MDMA (Ecstasy), urine              Cutoff 500 ng/mL Cocaine Metabolite, urine          Cutoff 300 ng/mL Opiate + metabolites, urine        Cutoff 300 ng/mL Phencyclidine (PCP), urine         Cutoff 25 ng/mL Cannabinoid, urine                 Cutoff 50 ng/mL Barbiturates + metabolites, urine  Cutoff 200 ng/mL Benzodiazepine, urine              Cutoff 200 ng/mL Methadone, urine  Cutoff 300 ng/mL  The urine drug screen provides only a  preliminary, unconfirmed analytical test result and should not be used for non-medical purposes. Clinical consideration and professional judgment should be applied to any positive drug screen result due to possible interfering substances. A more specific alternate chemical method must be used in order to obtain a confirmed analytical result. Gas chromatography / mass spectrometry (GC/MS) is the preferred confirm atory method. Performed at [facility], [address] [place], [place] [address]     No current facility-administered medications for this encounter.   Current Outpatient Medications  Medication Sig Dispense Refill   amLODipine (NORVASC) 5 MG tablet Take 0.5 tablets (2.5 mg total) by mouth daily. 30 tablet 1   gabapentin (NEURONTIN) 100 MG capsule Take 1 capsule (100 mg total) by mouth 3 (three) times daily. 90 capsule 1   hydrOXYzine (ATARAX/VISTARIL) 25 MG tablet Take 1 tablet (25 mg total) by mouth 3 (three) times daily as needed for anxiety. 30 tablet 0   traZODone (DESYREL) 50 MG tablet Take 1 tablet (50 mg total) by mouth at bedtime as needed for sleep. 60 tablet 1    Musculoskeletal: Strength & Muscle Tone: within normal limits Gait & Station: normal Patient leans: N/A            Psychiatric Specialty Exam:  Presentation  General Appearance: Appropriate for Environment  Eye Contact:Good  Speech:Clear and Coherent  Speech Volume:Normal  Handedness:Right   Mood and Affect  Mood:-- (stated "Depressed and suicidal")  Affect:Appropriate; Non-Congruent   Thought Process  Thought Processes:Coherent  Descriptions of Associations:Intact  Orientation:Full (Time, Place and Person)  Thought Content:WDL  History of Schizophrenia/Schizoaffective disorder:No  Duration of Psychotic Symptoms:No data recorded Hallucinations:Hallucinations: None  Ideas of Reference:None  Suicidal Thoughts:Suicidal Thoughts: No  Homicidal  Thoughts:Homicidal Thoughts: No   Sensorium  Memory:Immediate Good  Judgment:Fair  Insight:Fair   Executive Functions  Concentration:Fair  Attention Span:Fair  [place]   Psychomotor Activity  Psychomotor Activity:Psychomotor Activity: Normal  Assets  Assets:Social Support; Resilience; Physical Health; Housing   Sleep  Sleep:Sleep: Poor  Physical Exam: Physical Exam Vitals and nursing note reviewed.  HENT:     Head: Normocephalic.     Nose: No congestion or rhinorrhea.  Eyes:     General:        Right eye: No discharge.        Left eye: No discharge.  Cardiovascular:     Rate and Rhythm: Normal rate.  Pulmonary:     Effort: Pulmonary effort is normal.  Musculoskeletal:        General: Normal range of motion.     Cervical back: Normal range of motion.  Skin:    General: Skin is dry.  Neurological:     Mental Status: He is alert and oriented to person, place, and time.  Psychiatric:        Attention and Perception: Attention normal.        Mood and Affect: Mood normal.        Speech: Speech normal.        Behavior: Behavior normal.        Thought Content: Thought content includes suicidal (passive) ideation.        Cognition and Memory: Cognition normal.   Review of Systems  Psychiatric/Behavioral:  Positive for depression and suicidal ideas.   All other systems reviewed and are negative. Blood pressure 131/90, pulse 84, temperature 98 F (36.7 C), temperature source  Oral, resp. rate 18, height 6\' 2"  (1.88 m), weight 99.8 kg, SpO2 94 %. Body mass index is 28.25 kg/m.  Treatment Plan Summary: Daily contact with patient to assess and evaluate symptoms and progress in treatment, Medication management, and Plan admit to inpatient psychiatry  Disposition: Recommend psychiatric Inpatient admission when medically cleared.  Sherlon Handing, NP 02/12/2022 2:40 PM

## 2022-02-12 NOTE — Plan of Care (Signed)
?  Problem: Education: ?Goal: Knowledge of [facility] [place] [facility] Education information/materials will improve ?Outcome: Not Progressing ?Goal: Emotional status will improve ?Outcome: Not Progressing ?Goal: Mental status will improve ?Outcome: Not Progressing ?Goal: Verbalization of understanding the information provided will improve ?Outcome: Not Progressing ?  ?Problem: Activity: ?Goal: Interest or engagement in activities will improve ?Outcome: Not Progressing ?Goal: Sleeping patterns will improve ?Outcome: Not Progressing ?  ?Problem: Coping: ?Goal: Ability to verbalize frustrations and anger appropriately will improve ?Outcome: Not Progressing ?Goal: Ability to demonstrate self-control will improve ?Outcome: Not Progressing ?  ?Problem: Health Behavior/Discharge Planning: ?Goal: Identification of resources available to assist in meeting health care needs will improve ?Outcome: Not Progressing ?Goal: Compliance with treatment plan for underlying cause of condition will improve ?Outcome: Not Progressing ?  ?Problem: Physical Regulation: ?Goal: Ability to maintain clinical measurements within normal limits will improve ?Outcome: Not Progressing ?  ?Problem: Safety: ?Goal: Periods of time without injury will increase ?Outcome: Not Progressing ?  ?Problem: Education: ?Goal: Ability to state activities that reduce stress will improve ?Outcome: Not Progressing ?  ?Problem: Coping: ?Goal: Ability to identify and develop effective coping behavior will improve ?Outcome: Not Progressing ?  ?Problem: Self-Concept: ?Goal: Ability to identify factors that promote anxiety will improve ?Outcome: Not Progressing ?Goal: Level of anxiety will decrease ?Outcome: Not Progressing ?Goal: Ability to modify response to factors that promote anxiety will improve ?Outcome: Not Progressing ?  ?Problem: Education: ?Goal: Utilization of techniques to improve thought processes will improve ?Outcome: Not Progressing ?Goal: Knowledge of the  prescribed therapeutic regimen will improve ?Outcome: Not Progressing ?  ?Problem: Activity: ?Goal: Interest or engagement in leisure activities will improve ?Outcome: Not Progressing ?Goal: Imbalance in normal sleep/wake cycle will improve ?Outcome: Not Progressing ?  ?Problem: Coping: ?Goal: Coping ability will improve ?Outcome: Not Progressing ?Goal: Will verbalize feelings ?Outcome: Not Progressing ?  ?Problem: Health Behavior/Discharge Planning: ?Goal: Ability to make decisions will improve ?Outcome: Not Progressing ?Goal: Compliance with therapeutic regimen will improve ?Outcome: Not Progressing ?  ?Problem: Role Relationship: ?Goal: Will demonstrate positive changes in social behaviors and relationships ?Outcome: Not Progressing ?  ?Problem: Safety: ?Goal: Ability to disclose and discuss suicidal ideas will improve ?Outcome: Not Progressing ?Goal: Ability to identify and utilize support systems that promote safety will improve ?Outcome: Not Progressing ?  ?Problem: Self-Concept: ?Goal: Will verbalize positive feelings about self ?Outcome: Not Progressing ?Goal: Level of anxiety will decrease ?Outcome: Not Progressing ?  ?

## 2022-02-12 NOTE — ED Triage Notes (Signed)
Pt here wanting to talk to a psychiatrist. Pt has a hx of anxiety, depression, manic, bipolar-according to pt. Pt also cares for sick grandparents which is causing stress for him. Pt in NAD in triage. ?

## 2022-02-12 NOTE — BH Assessment (Signed)
Patient is to be admitted to [facility] BMU today 02/12/22 by Dr. Toni Amend.  ?Attending Physician will be Dr.  Toni Amend .   ?Patient has been assigned to room 324, by [facility] Charge Nurse Abeytas.   ? ?ER staff is aware of the admission: ?Glenda, ER Secretary   ?Dr. Marisa Severin, ER MD  ?Florentina Addison, Patient's Nurse  ?Sue Lush, Patient Access.  ?

## 2022-02-12 NOTE — BH Assessment (Signed)
Comprehensive Clinical Assessment (CCA) Note  02/12/2022 Lee Sandoval [id number]  Arlina Robes, 31 year old male who presents to [facility] ED voluntarily for treatment. Per triage note, Pt here wanting to talk to a psychiatrist. Pt has a hx of anxiety, depression, manic, bipolar-according to pt. Pt also cares for sick grandparents which is causing stress for him. Pt in NAD in triage.   During TTS assessment pt presents alert and oriented x 4, anxious but cooperative, and mood-congruent with affect. The pt does not appear to be responding to internal or external stimuli. Neither is the pt presenting with any delusional thinking. Pt verified the information provided to triage RN.   Pt identifies his main complaint to be that he is depressed, having suicidal thoughts and nothing makes him happy. Patient reports symptoms have been worsening over the past few weeks. Patient reports that he becomes immobile, emotional and then has episodes of rage; however, he can control himself and does not act on his emotions. Patient reports he is currently unemployed, has a few injuries to his back and takes care of his grandparents. Patient states these life stressors add to his already existing history of anxiety. Patient reports having consistent panic attacks that completely shut him down. Pt denies using any illicit substances and alcohol. Patient state he is compliant with his medications but they do not seem to be working. Pt denies current SI/HI/AH/VH. I just have thoughts when I get overwhelmed. Patient believes inpatient treatment would be beneficial at this time. Pt is not able to contract for safety.   Per Sallye Ober, NP, pt is recommended for inpatient psychiatric admission.    Chief Complaint:  Chief Complaint  Patient presents with   Anxiety   Visit Diagnosis: Clinical depression    CCA Screening, Triage and Referral (STR)  Patient Reported Information How did you hear about [place]?  Self  Referral name: No data recorded Referral phone number: No data recorded  Whom do you see for routine medical problems? No data recorded Practice/Facility Name: No data recorded Practice/Facility Phone Number: No data recorded Name of Contact: No data recorded Contact Number: No data recorded Contact Fax Number: No data recorded Prescriber Name: No data recorded Prescriber Address (if known): No data recorded  What Is the Reason for Your Visit/Call Today? Patient is presenting voluntarily to ED due to having depression, anxiety and SI.  How Long Has This Been Causing You Problems? > than 6 months  What Do You Feel Would Help You the Most Today? Treatment for Depression or other mood problem; Stress Management; Medication(s)   Have You Recently Been in Any Inpatient Treatment (Hospital/Detox/Crisis Center/28-Day Program)? No data recorded Name/Location of Program/Hospital:No data recorded How Long Were You There? No data recorded When Were You Discharged? No data recorded  Have You Ever Received Services From [facility] Before? No data recorded Who Do You See at [facility]? No data recorded  Have You Recently Had Any Thoughts About Hurting Yourself? Yes  Are You Planning to Commit Suicide/Harm Yourself At This time? No   Have you Recently Had Thoughts About Hurting Someone Karolee Ohs? No  Explanation: No data recorded  Have You Used Any Alcohol or Drugs in the Past 24 Hours? No  How Long Ago Did You Use Drugs or Alcohol? No data recorded What Did You Use and How Much? No data recorded  Do You Currently Have a Therapist/Psychiatrist? No  Name of Therapist/Psychiatrist: No data recorded  Have You Been Recently Discharged From  Any Public relations account executiveffice Practice or Programs? No  Explanation of Discharge From Practice/Program: No data recorded    CCA Screening Triage Referral Assessment Type of Contact: Face-to-Face  Is this Initial or Reassessment? No data recorded Date Telepsych  consult ordered in [facility]:  No data recorded Time Telepsych consult ordered in [facility]:  No data recorded  Patient Reported Information Reviewed? No data recorded Patient Left Without Being Seen? No data recorded Reason for Not Completing Assessment: No data recorded  Collateral Involvement: None provided   Does Patient Have a Court Appointed Legal Guardian? No data recorded Name and Contact of Legal Guardian: No data recorded If Minor and Not Living with Parent(s), Who has Custody? n/a  Is CPS involved or ever been involved? Never  Is APS involved or ever been involved? Never   Patient Determined To Be At Risk for Harm To Self or Others Based on Review of Patient Reported Information or Presenting Complaint? No  Method: No data recorded Availability of Means: No data recorded Intent: No data recorded Notification Required: No data recorded Additional Information for Danger to Others Potential: No data recorded Additional Comments for Danger to Others Potential: No data recorded Are There Guns or Other Weapons in Your Home? No data recorded Types of Guns/Weapons: No data recorded Are These Weapons Safely Secured?                            No data recorded Who Could Verify You Are Able To Have These Secured: No data recorded Do You Have any Outstanding Charges, Pending Court Dates, Parole/Probation? No data recorded Contacted To Inform of Risk of Harm To Self or Others: No data recorded  [facility]   Does Patient Present under Involuntary Commitment? No  IVC Papers Initial File Date: No data recorded  [place]   Patient Currently Receiving the Following Services: Medication Management   Determination of Need: Emergent (2 hours)   Options For Referral: ED Visit; Inpatient Hospitalization; Medication Management; Outpatient Therapy     CCA Biopsychosocial Intake/Chief Complaint:  No data recorded Current Symptoms/Problems: No  data recorded  Patient Reported Schizophrenia/Schizoaffective Diagnosis in Past: No   Strengths: Patient is able to communicate and verbalize needs.  Preferences: No data recorded Abilities: No data recorded  Type of Services Patient Feels are Needed: No data recorded  Initial Clinical Notes/Concerns: No data recorded  Mental Health Symptoms Depression:   Change in energy/activity; Difficulty Concentrating; Fatigue; Sleep (too much or little); Weight gain/loss; Increase/decrease in appetite; Hopelessness   Duration of Depressive symptoms:  Greater than two weeks   Mania:   Change in energy/activity   Anxiety:    Difficulty concentrating; Fatigue; Irritability; Restlessness; Sleep   Psychosis:   None   Duration of Psychotic symptoms: No data recorded  Trauma:   None   Obsessions:   None   Compulsions:   None   Inattention:   None   Hyperactivity/Impulsivity:   None   Oppositional/Defiant Behaviors:   None   Emotional Irregularity:   Intense/inappropriate anger   Other Mood/Personality Symptoms:  No data recorded   Mental Status Exam Appearance and self-care  Stature:   Tall   Weight:   Average weight   Clothing:   Casual   Grooming:   Normal   Cosmetic use:   None   Posture/gait:   Normal   Motor activity:   Not Remarkable   Sensorium  Attention:  Normal   Concentration:   Normal   Orientation:   X5   Recall/memory:   Normal   Affect and Mood  Affect:   Appropriate; Depressed   Mood:   Anxious; Depressed   Relating  Eye contact:   Normal   Facial expression:   Responsive   Attitude toward examiner:   Cooperative   Thought and Language  Speech flow:  Clear and Coherent   Thought content:   Appropriate to Mood and Circumstances   Preoccupation:   None   Hallucinations:   None   Organization:  No data recorded  [employer] of Knowledge:   Fair   Intelligence:   Average    Abstraction:   Normal   Judgement:   Fair   Dance movement psychotherapist:   Realistic   Insight:   Fair   Decision Making:   Normal   Social Functioning  Social Maturity:   Responsible   Social Judgement:   Normal   Stress  Stressors:   Surveyor, quantity; Work; Transitions   Coping Ability:   Contractor Deficits:   None   Supports:   Family; Friends/Service system     Religion:    Leisure/Recreation:    Exercise/Diet: Exercise/Diet Have You Gained or Lost A Significant Amount of Weight in the Past Six Months?: Yes-Lost Number of Pounds Lost?: 17 Do You Follow a Special Diet?: No Do You Have Any Trouble Sleeping?: Yes Explanation of Sleeping Difficulties: Patient reports sleeping 6 hours in 3 days.   CCA Employment/Education Employment/Work Situation: Employment / Work Situation Employment Situation: Unemployed  Education:     CCA Family/Childhood History Family and Relationship History: Family history Marital status: Single Does patient have children?: Yes How many children?: 3 How is patient's relationship with their children?: Patient has a good relationship with his children  Childhood History:     Child/Adolescent Assessment:     CCA Substance Use Alcohol/Drug Use: Alcohol / Drug Use Pain Medications: See MAR Prescriptions: See MAR Over the Counter: See MAR History of alcohol / drug use?: No history of alcohol / drug abuse                         ASAM's:  Six Dimensions of Multidimensional Assessment  Dimension 1:  Acute Intoxication and/or Withdrawal Potential:      Dimension 2:  Biomedical Conditions and Complications:      Dimension 3:  Emotional, Behavioral, or Cognitive Conditions and Complications:     Dimension 4:  Readiness to Change:     Dimension 5:  Relapse, Continued use, or Continued Problem Potential:     Dimension 6:  Recovery/Living Environment:     ASAM Severity Score:    ASAM Recommended Level of  Treatment:     Substance use Disorder (SUD)    Recommendations for Services/Supports/Treatments:    DSM5 Diagnoses: Patient Active Problem List   Diagnosis Date Noted   MDD (major depressive disorder) 02/12/2022   Acute appendicitis with localized peritonitis 08/08/2019   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   Concussion without loss of consciousness 12/04/2015   Intermittent palpitations 07/04/2015   Generalized anxiety disorder 07/04/2015   Cigarette smoker 06/12/2015   Herniated lumbar intervertebral disc 04/19/2014   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

## 2022-02-12 NOTE — Progress Notes (Signed)
Pt calm and pleasant during assessment denying SI/HI/AVH. Pt endorses anxiety and depression. Pt requested something for sleep PRN, this writer called the NP and is awaiting to hear back. Pt didn't have any scheduled medications tonight. Pt given education, support, and encouragement to be active in his treatment plan. Pt being monitored Q 15 minutes for safety per unit protocol. Pt remains safe on the unit.  ?

## 2022-02-12 NOTE — ED Provider Notes (Signed)
? ?  [facility] ?Provider Note ? ? ? Event Date/Time  ? First MD Initiated Contact with Patient 02/12/22 1206   ?  (approximate) ? ? ?History  ? ?Anxiety ? ? ?HPI ? ?Lee Sandoval is a 31 y.o. male who reports a lot of anxiety.  He also had some suicidal thoughts.  He could see himself doing things in his mind as I but thought he would never actually do anything.  Still he is having a lot of anxiety and problems managing it.  He says underneath the anxiety is some rage. ?  ?Patient denies any medical problems. ? ?Physical Exam  ? ?Triage Vital Signs: ?ED Triage Vitals  ?Enc Vitals Group  ?   BP 02/12/22 1051 131/90  ?   Pulse Rate 02/12/22 1051 83  ?   Resp 02/12/22 1051 18  ?   Temp 02/12/22 1051 98 ?F (36.7 ?C)  ?   Temp Source 02/12/22 1051 Oral  ?   SpO2 02/12/22 1051 94 %  ?   Weight 02/12/22 1053 220 lb 0.3 oz (99.8 kg)  ?   Height 02/12/22 1053 6\' 2"  (1.88 m)  ?   Head Circumference --   ?   Peak Flow --   ?   Pain Score 02/12/22 1053 0  ?   Pain Loc --   ?   Pain Edu? --   ?   Excl. in GC? --   ? ? ?Most recent vital signs: ?Vitals:  ? 02/12/22 1051 02/12/22 1053  ?BP: 131/90   ?Pulse: 83 84  ?Resp: 18 18  ?Temp: 98 ?F (36.7 ?C) 98 ?F (36.7 ?C)  ?SpO2: 94%   ? ? ? ?General: Awake, no distress.  ?CV:  Good peripheral perfusion.  Heart regular rate and rhythm no audible murmur ?Resp:  Normal effort.  Lungs are clear ?Abd:  No distention.  Soft and nontender.  Patient reports occasionally has some pains here and there since his appendectomy. ?Other:  Extremities no edema ? ? ?ED Results / Procedures / Treatments  ? ?Labs ?(all labs ordered are listed, but only abnormal results are displayed) ?Labs Reviewed  ?URINE DRUG SCREEN, QUALITATIVE ([facility] ONLY) - Abnormal; Notable for the following components:  ?    Result Value  ? Benzodiazepine, Ur Scrn POSITIVE (*)   ? All other components within normal limits  ?RESP PANEL BY RT-PCR (FLU A&B, COVID) ARPGX2  ?COMPREHENSIVE METABOLIC PANEL   ?ETHANOL  ?CBC  ? ? ? ?EKG ? ? ? ? ?RADIOLOGY ? ? ? ?PROCEDURES: ? ?Critical Care performed:  ? ?Procedures ? ? ?MEDICATIONS ORDERED IN ED: ?Medications - No data to display ? ? ?IMPRESSION / MDM / ASSESSMENT AND PLAN / ED COURSE  ?I reviewed the triage vital signs and the nursing notes. ?Labs look okay.  I will wait psychiatric consult on this gentleman ? ? ? ? ?  ? ? ?FINAL CLINICAL IMPRESSION(S) / ED DIAGNOSES  ? ?Final diagnoses:  ?Anxiety  ?Episode of recurrent major depressive disorder, unspecified depression episode severity (HCC)  ?Suicidal thoughts  ? ? ? ?Rx / DC Orders  ? ?ED Discharge Orders   ? ? None  ? ?  ? ? ? ?Note:  This document was prepared using Dragon voice recognition software and may include unintentional dictation errors. ?  ?04/14/22, MD ?02/12/22 1619 ? ?

## 2022-02-12 NOTE — Tx Team (Signed)
Initial Treatment Plan ?02/12/2022 ?5:54 PM ?Lee Sandoval ?[id number] ? ? ? ?PATIENT STRESSORS: ?Medication change or noncompliance   ?Other: caregiver's stress   ? ? ?PATIENT STRENGTHS: ?Capable of independent living  ?Motivation for treatment/growth  ?Physical Health  ?Supportive family/friends  ? ? ?PATIENT IDENTIFIED PROBLEMS: ?Ineffective coping skills  ? Non medication compliance  ?  ?  ?  ?  ?  ?  ?  ?  ? ?DISCHARGE CRITERIA:  ?Improved stabilization in mood, thinking, and/or behavior ?Motivation to continue treatment in a less acute level of care ? ?PRELIMINARY DISCHARGE PLAN: ?Outpatient therapy ?Return to previous living arrangement ? ?PATIENT/FAMILY INVOLVEMENT: ?This treatment plan has been presented to and reviewed with the patient, Lee Sandoval, and/or family member.  The patient and family have been given the opportunity to ask questions and make suggestions. ? ?Aleen Sells, RN ?02/12/2022, 5:54 PM ?

## 2022-02-12 NOTE — ED Notes (Signed)
Pt belongings: ? ?1 red Mario shirt ?1 pair of jeans ?1 Atari hat ?1 pair of gray sneakers ?1 black jacket ?1 black wallet ?1 black cell phone ?1 pair of black socks ?1 pair of gray boxers ? ? 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 ?

## 2022-02-12 NOTE — Progress Notes (Signed)
Pt denies SI/HI/AVH during assessment. Pt report experiencing increased anxiety/depression. Pt reports experiencing caregiver's stress in regards to grandparents. Pt share he has a positive support system however needs help with mental stability and medication. Pt share he has no insurance however his wife works and makes financially enough to take care of bills and such. Pt lives with is wife and 3 children ( 33 yr old girl, 16 year old boy, 33 year old boy) in a house. Pt states he vapes however he has not smoke cigarettes in years and has been clean of substance abuse for 5 years. Pt states he occasionally drinks socially. Pt's stressor is caring for is grandfather who recently developed a growth on his tongue, in which the MD told his to prepare himself for the loss of his grandfather to take place soon. Pt's other stressor is caring for his grandparents with no additional assistance, which while caring for them in the moment doesn't appear to be a problem until he gets home to his wife and children at which point he realizes is exhausting/stressful. Pt is calm and cooperative throughout admission. Pt's skin assessment preformed upon admission. Pt's skin is intact and appears free of injury.  ?

## 2022-02-13 ENCOUNTER — Other Ambulatory Visit: Payer: Self-pay

## 2022-02-13 DIAGNOSIS — F332 Major depressive disorder, recurrent severe without psychotic features: Secondary | ICD-10-CM

## 2022-02-13 DIAGNOSIS — F410 Panic disorder [episodic paroxysmal anxiety] without agoraphobia: Secondary | ICD-10-CM

## 2022-02-13 MED ORDER — PAROXETINE HCL 10 MG PO TABS
10.0000 mg | ORAL_TABLET | Freq: Every day | ORAL | 0 refills | Status: DC
  Filled 2022-02-13: qty 10, 10d supply, fill #0

## 2022-02-13 MED ORDER — NICOTINE 14 MG/24HR TD PT24
14.0000 mg | MEDICATED_PATCH | Freq: Every day | TRANSDERMAL | 0 refills | Status: DC
  Filled 2022-02-13: qty 14, 14d supply, fill #0

## 2022-02-13 MED ORDER — PAROXETINE HCL 10 MG PO TABS
10.0000 mg | ORAL_TABLET | Freq: Every day | ORAL | Status: DC
  Administered 2022-02-13 – 2022-02-14 (×2): 10 mg via ORAL
  Filled 2022-02-13 (×3): qty 1
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
## 2022-02-13 NOTE — Progress Notes (Signed)
Recreation Therapy Notes ? ?Date: 02/13/2022 ?  ?Time: 10:25 am   ?  ?Location: Craft room   ?  ?Behavioral response: N/A ?  ?Intervention Topic: Relaxation  ?  ?Discussion/Intervention: ?Patient refused to attend group.  ?  ?Clinical Observations/Feedback:  ?Patient refused to attend group.  ?  ?Virgen Belland LRT/CTRS ? ? ? ? ? ? ? ?Carma Dwiggins ?02/13/2022 12:46 PM ?
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
## 2022-02-13 NOTE — Progress Notes (Signed)
D: Pt alert and awake this shift. Present on the milieu. Denies  SI/HI/AVH, anxiety, depression and pain.  ?A: Pt provided support and encouragement.Participates in groups and interacts appropriately with staff and peers.  Pt given medication per protocol and standing orders. Q36m safety checks implemented and continued.  ?R: Pt safe on the unit. No adverse reactions to medications noted. Will continue to monitor.  ?

## 2022-02-13 NOTE — Group Note (Signed)
[facility] LCSW Group Therapy Note ? ? ?Group Date: 02/13/2022 ?Start Time: 1300 ?End Time: 1400 ? ? ?Type of Therapy/Topic:  Group Therapy:  Balance in Life ? ?Participation Level:  Active  ? ?Description of Group:   ? This group will address the concept of balance and how it feels and looks when one is unbalanced. Patients will be encouraged to process areas in their lives that are out of balance, and identify reasons for remaining unbalanced. Facilitators will guide patients utilizing problem- solving interventions to address and correct the stressor making their life unbalanced. Understanding and applying boundaries will be explored and addressed for obtaining  and maintaining a balanced life. Patients will be encouraged to explore ways to assertively make their unbalanced needs known to significant others in their lives, using other group members and facilitator for support and feedback. ? ?Therapeutic Goals: ?Patient will identify two or more emotions or situations they have that consume much of in their lives. ?Patient will identify signs/triggers that life has become out of balance:  ?Patient will identify two ways to set boundaries in order to achieve balance in their lives:  ?Patient will demonstrate ability to communicate their needs through discussion and/or role plays ? ?Summary of Patient Progress: ?Patient was present for the entirety of the group session. Patient was an active listener and participated in the topic of discussion, provided helpful advice to others, and added nuance to topic of conversation. Patient was hyperverbal, though helpful in moving conversation. States he would like to work on finding balance with his sleep. States he only got 6 hours in the past 3 days.  ? ? ? ?Therapeutic Modalities:   ?Cognitive Behavioral Therapy ?Solution-Focused Therapy ?Assertiveness Training ? ? ?Corky Crafts, LCSWA ?

## 2022-02-13 NOTE — Plan of Care (Signed)
?  Problem: Education: ?Goal: Knowledge of [facility] Education information/materials will improve ?Outcome: Progressing ?Goal: Emotional status will improve ?Outcome: Progressing ?Goal: Mental status will improve ?Outcome: Progressing ?Goal: Verbalization of understanding the information provided will improve ?Outcome: Progressing ?  ?Problem: Activity: ?Goal: Interest or engagement in activities will improve ?Outcome: Progressing ?Goal: Sleeping patterns will improve ?Outcome: Progressing ?  ?Problem: Coping: ?Goal: Ability to verbalize frustrations and anger appropriately will improve ?Outcome: Progressing ?Goal: Ability to demonstrate self-control will improve ?Outcome: Progressing ?  ?Problem: Health Behavior/Discharge Planning: ?Goal: Identification of resources available to assist in meeting health care needs will improve ?Outcome: Progressing ?Goal: Compliance with treatment plan for underlying cause of condition will improve ?Outcome: Progressing ?  ?Problem: Physical Regulation: ?Goal: Ability to maintain clinical measurements within normal limits will improve ?Outcome: Progressing ?  ?Problem: Safety: ?Goal: Periods of time without injury will increase ?Outcome: Progressing ?  ?Problem: Education: ?Goal: Ability to state activities that reduce stress will improve ?Outcome: Progressing ?  ?Problem: Coping: ?Goal: Ability to identify and develop effective coping behavior will improve ?Outcome: Progressing ?  ?Problem: Coping: ?Goal: Ability to identify and develop effective coping behavior will improve ?Outcome: Progressing ?  ?Problem: Self-Concept: ?Goal: Ability to identify factors that promote anxiety will improve ?Outcome: Progressing ?Goal: Level of anxiety will decrease ?Outcome: Progressing ?Goal: Ability to modify response to factors that promote anxiety will improve ?Outcome: Progressing ?  ?Problem: Education: ?Goal: Utilization of techniques to improve thought processes will  improve ?Outcome: Progressing ?Goal: Knowledge of the prescribed therapeutic regimen will improve ?Outcome: Progressing ?  ?Problem: Activity: ?Goal: Interest or engagement in leisure activities will improve ?Outcome: Progressing ?Goal: Imbalance in normal sleep/wake cycle will improve ?Outcome: Progressing ?  ?Problem: Coping: ?Goal: Coping ability will improve ?Outcome: Progressing ?Goal: Will verbalize feelings ?Outcome: Progressing ?  ?Problem: Health Behavior/Discharge Planning: ?Goal: Ability to make decisions will improve ?Outcome: Progressing ?Goal: Compliance with therapeutic regimen will improve ?Outcome: Progressing ?  ?Problem: Role Relationship: ?Goal: Will demonstrate positive changes in social behaviors and relationships ?Outcome: Progressing ?  ?Problem: Safety: ?Goal: Ability to disclose and discuss suicidal ideas will improve ?Outcome: Progressing ?Goal: Ability to identify and utilize support systems that promote safety will improve ?Outcome: Progressing ?  ?Problem: Self-Concept: ?Goal: Will verbalize positive feelings about self ?Outcome: Progressing ?Goal: Level of anxiety will decrease ?Outcome: Progressing ?  ?

## 2022-02-13 NOTE — [facility] Suicide Risk Assessment (Signed)
[facility] INPATIENT:  Family/Significant Other Suicide Prevention Education ? ?Suicide Prevention Education:  ?Patient Refusal for Family/Significant Other Suicide Prevention Education: The patient Lee Sandoval has refused to provide written consent for family/significant other to be provided Family/Significant Other Suicide Prevention Education during admission and/or prior to discharge.  Physician notified. ? ?SPE completed with pt, as pt refused to consent to family contact. SPI pamphlet provided to pt and pt was encouraged to share information with support network, ask questions, and talk about any concerns relating to SPE. Pt denies access to guns/firearms and verbalized understanding of information provided. Mobile Crisis information also provided to pt.  ? ? ?Harden Mo ?02/13/2022, 12:49 PM ?
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
## 2022-02-13 NOTE — Progress Notes (Signed)
Pt calm and pleasant during assessment denying SI/HI/AVH. Pt endorses anxiety and depression. Pt didn't have any scheduled medications tonight. Pt given education, support, and encouragement to be active in his treatment plan. Pt being monitored Q 15 minutes for safety per unit protocol. Pt remains safe on the unit.  ?

## 2022-02-14 MED ORDER — NICOTINE 14 MG/24HR TD PT24: 14.0000 mg | MEDICATED_PATCH | Freq: Every day | TRANSDERMAL | 0 refills | Status: AC

## 2022-02-14 MED ORDER — MELATONIN 10 MG PO TABS: 10.0000 mg | ORAL_TABLET | Freq: Every evening | ORAL | 1 refills | Status: AC | PRN

## 2022-02-14 MED ORDER — PAROXETINE HCL 10 MG PO TABS: 10.0000 mg | ORAL_TABLET | Freq: Every day | ORAL | 1 refills | Status: AC

## 2022-02-14 NOTE — BH IP Treatment Plan (Addendum)
Interdisciplinary Treatment and Diagnostic Plan Update ? ?02/14/2022 ?Time of Session: 9:30 AM ?Lee Sandoval ?MRN: [id number] ? ?Principal Diagnosis: MDD (major depressive disorder) ? ?Secondary Diagnoses: Principal Problem: ?  MDD (major depressive disorder) ?Active Problems: ?  Generalized anxiety disorder ?  Panic attacks ? ? ?Current Medications:  ?Current Facility-Administered Medications  ?Medication Dose Route Frequency Provider Last Rate Last Admin  ? acetaminophen (TYLENOL) tablet 650 mg  650 mg Oral Q6H PRN Vanetta Mulders, NP      ? alum & mag hydroxide-simeth (MAALOX/MYLANTA) 200-200-20 MG/5ML suspension 30 mL  30 mL Oral Q4H PRN Vanetta Mulders, NP      ? magnesium hydroxide (MILK OF MAGNESIA) suspension 30 mL  30 mL Oral Daily PRN Gabriel Cirri F, NP      ? melatonin tablet 10 mg  10 mg Oral QHS PRN Gillermo Murdoch, NP   10 mg at 02/12/22 2214  ? nicotine (NICODERM CQ - dosed in mg/24 hours) patch 14 mg  14 mg Transdermal Daily Clapacs, Jackquline Denmark, MD   14 mg at 02/14/22 0355  ? PARoxetine (PAXIL) tablet 10 mg  10 mg Oral Daily Clapacs, Jackquline Denmark, MD   10 mg at 02/14/22 0858  ? ?PTA Medications: ?Medications Prior to Admission  ?Medication Sig Dispense Refill Last Dose  ? amLODipine (NORVASC) 5 MG tablet Take 0.5 tablets (2.5 mg total) by mouth daily. 30 tablet 1   ? gabapentin (NEURONTIN) 100 MG capsule Take 1 capsule (100 mg total) by mouth 3 (three) times daily. 90 capsule 1   ? hydrOXYzine (ATARAX/VISTARIL) 25 MG tablet Take 1 tablet (25 mg total) by mouth 3 (three) times daily as needed for anxiety. 30 tablet 0   ? traZODone (DESYREL) 50 MG tablet Take 1 tablet (50 mg total) by mouth at bedtime as needed for sleep. 60 tablet 1   ? ? ?Patient Stressors: Medication change or noncompliance   ?Other: caregiver's stress   ? ?Patient Strengths: Capable of independent living  ?Motivation for treatment/growth  ?Physical Health  ?Supportive family/friends  ? ?Treatment Modalities: Medication  Management, Group therapy, Case management,  ?1 to 1 session with clinician, Psychoeducation, Recreational therapy. ? ? ?Physician Treatment Plan for Primary Diagnosis: MDD (major depressive disorder) ?Long Term Goal(s): Improvement in symptoms so as ready for discharge  ? ?Short Term Goals: Compliance with prescribed medications will improve ?Ability to verbalize feelings will improve ?Ability to demonstrate self-control will improve ? ?Medication Management: Evaluate patient's response, side effects, and tolerance of medication regimen. ? ?Therapeutic Interventions: 1 to 1 sessions, Unit Group sessions and Medication administration. ? ?Evaluation of Outcomes: Adequate for Discharge ? ?Physician Treatment Plan for Secondary Diagnosis: Principal Problem: ?  MDD (major depressive disorder) ?Active Problems: ?  Generalized anxiety disorder ?  Panic attacks ? ?Long Term Goal(s): Improvement in symptoms so as ready for discharge  ? ?Short Term Goals: Compliance with prescribed medications will improve ?Ability to verbalize feelings will improve ?Ability to demonstrate self-control will improve    ? ?Medication Management: Evaluate patient's response, side effects, and tolerance of medication regimen. ? ?Therapeutic Interventions: 1 to 1 sessions, Unit Group sessions and Medication administration. ? ?Evaluation of Outcomes: Adequate for Discharge ? ? ?RN Treatment Plan for Primary Diagnosis: MDD (major depressive disorder) ?Long Term Goal(s): Knowledge of disease and therapeutic regimen to maintain health will improve ? ?Short Term Goals: Ability to remain free from injury will improve, Ability to verbalize frustration and anger appropriately will improve, Ability to demonstrate  self-control, Ability to participate in decision making will improve, Ability to verbalize feelings will improve, Ability to disclose and discuss suicidal ideas, Ability to identify and develop effective coping behaviors will improve, and  Compliance with prescribed medications will improve ? ?Medication Management: RN will administer medications as ordered by provider, will assess and evaluate patient's response and provide education to patient for prescribed medication. RN will report any adverse and/or side effects to prescribing provider. ? ?Therapeutic Interventions: 1 on 1 counseling sessions, Psychoeducation, Medication administration, Evaluate responses to treatment, Monitor vital signs and CBGs as ordered, Perform/monitor CIWA, COWS, AIMS and Fall Risk screenings as ordered, Perform wound care treatments as ordered. ? ?Evaluation of Outcomes: Adequate for Discharge ? ? ?LCSW Treatment Plan for Primary Diagnosis: MDD (major depressive disorder) ?Long Term Goal(s): Safe transition to appropriate next level of care at discharge, Engage patient in therapeutic group addressing interpersonal concerns. ? ?Short Term Goals: Engage patient in aftercare planning with referrals and resources, Increase social support, Increase ability to appropriately verbalize feelings, Increase emotional regulation, Facilitate acceptance of mental health diagnosis and concerns, and Increase skills for wellness and recovery ? ?Therapeutic Interventions: Assess for all discharge needs, 1 to 1 time with Child psychotherapist, Explore available resources and support systems, Assess for adequacy in community support network, Educate family and significant other(s) on suicide prevention, Complete Psychosocial Assessment, Interpersonal group therapy. ? ?Evaluation of Outcomes: Adequate for Discharge ? ? ?Progress in Treatment: ?Attending groups: Yes. ?Participating in groups: Yes. ?Taking medication as prescribed: Yes. ?Toleration medication: Yes. ?Family/Significant other contact made: No, will contact:  when/if given permission. ?Patient understands diagnosis: Yes. ?Discussing patient identified problems/goals with staff: Yes. ?Medical problems stabilized or resolved: Yes. ?Denies  suicidal/homicidal ideation: Yes. ?Issues/concerns per patient self-inventory: No. ?Other: none. ? ?New problem(s) identified: No, Describe:  none. ? ?New Short Term/Long Term Goal(s): medication management for mood stabilization; elimination of SI thoughts; development of comprehensive mental wellness plan. ? ?Patient Goals: "To be evaluated, trying to get pointed in the right direction of what my psychological needs are, place to decompress, and medicines for that."  ? ?Discharge Plan or Barriers: Pt to discharge to private address and will continue with outpatient services through [facility]. ? ?Reason for Continuation of Hospitalization: Anxiety ?Depression ?Medication stabilization ?Suicidal ideation ? ?Estimated Length of Stay: 1-7 days ? ? ?Scribe for Treatment Team: ?Glenis Smoker, LCSW ?02/14/2022 ?10:12 AM ?

## 2022-02-14 NOTE — Progress Notes (Signed)
?  [facility] Adult Case Management Discharge Plan : ? ?Will you be returning to the same living situation after discharge:  Yes,  Patient return to place of residence w/ spouse.  ?At discharge, do you have transportation home?: Yes,  Patient's family to assist with transportation from hospital.  ?Do you have the ability to pay for your medications: No. No insurance noted, patient to be provided w/ 7 day supply at discharge.  ? ?Release of information consent forms completed and in the chart;  Patient's signature needed at discharge. ? ?Patient to Follow up at: ? Follow-up Information   ? ? [facility] Go on 02/19/2022.   ?Why: Please present for scheduled appointment at [facility], meet Harvey at clinic, Wednesday 3/08 at 0730AM. ?Contact information: ?[address] ?[place] [address] ?[phone] ? ? ?  ?  ? ?  ?  ? ?  ? ? ?Next level of care provider has access to [facility] Link:no ? ?Safety Planning and Suicide Prevention discussed: Yes,  SPE completed with patient, declined consent for CSW to reach collateral.  ?  ?Has patient been referred to the Quitline?: Patient refused referral ? ?Patient has been referred for addiction treatment: Pt. refused referral UDS positive for benzodiazepines, denied active substances use.  ? ?Glenis Smoker, LCSW ?02/14/2022, 10:41 AM ?

## 2022-02-14 NOTE — Discharge Summary (Signed)
Physician Discharge Summary Note  Patient:  Lee Sandoval is an 31 y.o., male MRN:  [id number] DOB:  1991-04-23 Patient phone:  [phone] (home)  Patient address:   [address] [place] [address],  Total Time spent with patient: 30 minutes  Date of Admission:  02/12/2022 Date of Discharge: 02/14/2022  Reason for Admission: Admitted after presenting to the emergency room with symptoms of anxiety and depression and passive suicidal thoughts  Principal Problem: MDD (major depressive disorder) Discharge Diagnoses: Principal Problem:   MDD (major depressive disorder) Active Problems:   Generalized anxiety disorder   Panic attacks   Past Psychiatric History: Past history of depression and anxiety without previous concerted outpatient treatment  Past Medical History:  Past Medical History:  Diagnosis Date   Attention deficit hyperactivity disorder (ADHD), combined type, mild    Generalized anxiety disorder    GERD (gastroesophageal reflux disease)    Hypertension     Past Surgical History:  Procedure Laterality Date   APPENDECTOMY     LAPAROSCOPIC APPENDECTOMY N/A 08/09/2019   Procedure: APPENDECTOMY LAPAROSCOPIC;  Surgeon: Herbert Pun, MD;  Location: [facility] ORS;  Service: General;  Laterality: N/A;   TONSILLECTOMY     TOOTH EXTRACTION     Family History:  Family History  Problem Relation Age of Onset   Heart failure Father        died at age 75   Depression Mother    Mental illness Mother    Family Psychiatric  History: See previous.  Depression in the family Social History:  Social History   Substance and Sexual Activity  Alcohol Use Yes   Comment: occasionally      Social History   Substance and Sexual Activity  Drug Use Not Currently   Types: Marijuana    Social History   Socioeconomic History   Marital status: Married    Spouse name: Not on file   Number of children: 2   Years of education: Not on file   Highest education  level: Not on file  Occupational History   Occupation: Gaffer  Tobacco Use   Smoking status: Former    Packs/day: 1.00    Types: Cigarettes   Smokeless tobacco: Current  Vaping Use   Vaping Use: Some days   Substances: Nicotine, Flavoring  Substance and Sexual Activity   Alcohol use: Yes    Comment: occasionally    Drug use: Not Currently    Types: Marijuana   Sexual activity: Yes    Partners: Female    Birth control/protection: None  Other Topics Concern   Not on file  Social History Narrative   Not on file   Social Determinants of Health   Financial Resource Strain: Not on file  Food Insecurity: Not on file  Transportation Needs: Not on file  Physical Activity: Not on file  Stress: Not on file  Social Connections: Not on file    Hospital Course: Patient admitted to psychiatric ward.  15-minute checks continued.  Patient did not display any dangerous aggressive or violent or suicidal behavior.  He was cooperative with treatment team and providers.  Agreed to plan to start low-dose paroxetine and attend therapy.  At the time of discharge patient is denying all suicidal ideation and is calm and appropriate with no sign of psychosis.  He has met with provider representative from [facility] and agrees to follow-up after leaving the hospital.  He is provided with paroxetine 10 mg a day as a treatment for  anxiety and depression  Physical Findings: AIMS:  , ,  ,  ,    CIWA:    COWS:     Musculoskeletal: Strength & Muscle Tone: within normal limits Gait & Station: normal Patient leans: N/A   Psychiatric Specialty Exam:  Presentation  General Appearance: Appropriate for Environment  Eye Contact:Good  Speech:Clear and Coherent  Speech Volume:Normal  Handedness:Right   Mood and Affect  Mood:-- (stated "Depressed and suicidal")  Affect:Appropriate; Non-Congruent   Thought Process  Thought Processes:Coherent  Descriptions of  Associations:Intact  Orientation:Full (Time, Place and Person)  Thought Content:WDL  History of Schizophrenia/Schizoaffective disorder:No  Duration of Psychotic Symptoms:No data recorded Hallucinations:No data recorded Ideas of Reference:None  Suicidal Thoughts:No data recorded Homicidal Thoughts:No data recorded  Sensorium  Memory:Immediate Good  Judgment:Fair  Insight:Fair   Executive Functions  Concentration:Fair  Attention Span:Fair  [place]   Psychomotor Activity  Psychomotor Activity:No data recorded  Assets  Assets:Social Support; Resilience; Physical Health; Housing   Sleep  Sleep:No data recorded   Physical Exam: Physical Exam Vitals and nursing note reviewed.  Constitutional:      Appearance: Normal appearance.  HENT:     Head: Normocephalic and atraumatic.     Mouth/Throat:     Pharynx: Oropharynx is clear.  Eyes:     Pupils: Pupils are equal, round, and reactive to light.  Cardiovascular:     Rate and Rhythm: Normal rate and regular rhythm.  Pulmonary:     Effort: Pulmonary effort is normal.     Breath sounds: Normal breath sounds.  Abdominal:     General: Abdomen is flat.     Palpations: Abdomen is soft.  Musculoskeletal:        General: Normal range of motion.  Skin:    General: Skin is warm and dry.  Neurological:     General: No focal deficit present.     Mental Status: He is alert. Mental status is at baseline.  Psychiatric:        Mood and Affect: Mood normal.        Thought Content: Thought content normal.   Review of Systems  Constitutional: Negative.   HENT: Negative.    Eyes: Negative.   Respiratory: Negative.    Cardiovascular: Negative.   Gastrointestinal: Negative.   Musculoskeletal: Negative.   Skin: Negative.   Neurological: Negative.   Psychiatric/Behavioral: Negative.    Blood pressure 124/85, pulse 64, temperature 98 F (36.7 C), temperature source Oral,  resp. rate 18, height 6' 2"  (1.88 m), weight 94.3 kg, SpO2 99 %. Body mass index is 26.71 kg/m.   Social History   Tobacco Use  Smoking Status Former   Packs/day: 1.00   Types: Cigarettes  Smokeless Tobacco Current   Tobacco Cessation:  A prescription for an FDA-approved tobacco cessation medication provided at discharge   Blood Alcohol level:  Lab Results  Component Value Date   ETH <10 02/12/2022   ETH <10 06/09/9484    Metabolic Disorder Labs:  No results found for: HGBA1C, MPG No results found for: PROLACTIN Lab Results  Component Value Date   CHOL 109 (L) 09/26/2016   TRIG 57 09/26/2016   HDL 46 09/26/2016   CHOLHDL 2.4 09/26/2016   VLDL 11 09/26/2016   LDLCALC 52 09/26/2016    See Psychiatric Specialty Exam and Suicide Risk Assessment completed by Attending Physician prior to discharge.  Discharge destination:  Home  Is patient on multiple antipsychotic therapies at discharge:  No  Has Patient had three or more failed trials of antipsychotic monotherapy by history:  No  Recommended Plan for Multiple Antipsychotic Therapies: NA  Discharge Instructions     Diet - low sodium heart healthy   Complete by: As directed    Increase activity slowly   Complete by: As directed       Allergies as of 02/14/2022       Reactions   Sulfa Antibiotics Hives   High fever.    Tramadol Hcl Other (See Comments)   Reaction: unknown        Medication List     STOP taking these medications    amLODipine 5 MG tablet Commonly known as: NORVASC   gabapentin 100 MG capsule Commonly known as: NEURONTIN   hydrOXYzine 25 MG tablet Commonly known as: ATARAX   traZODone 50 MG tablet Commonly known as: DESYREL       TAKE these medications      Indication  Melatonin 10 MG Tabs Take 10 mg by mouth at bedtime as needed.  Indication: Trouble Sleeping   nicotine 14 mg/24hr patch Commonly known as: NICODERM CQ - dosed in mg/24 hours Place 1 patch (14 mg  total) onto the skin daily.  Indication: Nicotine Addiction   PARoxetine 10 MG tablet Commonly known as: PAXIL Take 1 tablet (10 mg total) by mouth daily.  Indication: Generalized Anxiety Disorder, Major Depressive Disorder, Panic Disorder        Follow-up Information     [facility] on 02/19/2022.   Why: Please present for scheduled appointment at [facility], meet Harvey at clinic, Wednesday 3/08 at 0730AM. Contact information: [address] [place] [address] [phone]                 Follow-up recommendations: Follow up with [facility].  Continue current medication as prescribed for now.  Comments: Prescription and supply provided  Signed: Alethia Berthold, MD 02/14/2022, 10:32 AM

## 2022-02-14 NOTE — Progress Notes (Signed)
D: Pt awake and alert this shift. Present on unit. Denies SI/HI/AVH, pain, anxiety 4/10 and depression 3/10.  ?A: Pt provided support and encouragement. Pt given medication per protocol and standing orders. Q56m safety checks implemented and continued.  ?R: Pt remains safe on the unit. Will continue to monitor.  ?

## 2022-02-14 NOTE — Progress Notes (Signed)
D: Pt alert and oriented. Pt denies experiencing any pain, SI/HI, or AVH at this time. Pt reports he will be able to keep himself safe when he return home. ?  ?A: Pt received discharge and medication education/information. Pt belongings were returned and signed for at this time to include printed prescriptions and valuables.  ?  ?R: Pt verbalized understanding of discharge and medication education/information. ?  ?Pt escorted by staff to the medical mall front lobby where safe transport picked pt up and transported to cousin's home.     ?

## 2022-02-14 NOTE — Plan of Care (Signed)
?  Problem: Education: ?Goal: Knowledge of [facility] Education information/materials will improve ?Outcome: Adequate for Discharge ?Goal: Emotional status will improve ?Outcome: Adequate for Discharge ?Goal: Mental status will improve ?Outcome: Adequate for Discharge ?Goal: Verbalization of understanding the information provided will improve ?Outcome: Adequate for Discharge ?  ?Problem: Activity: ?Goal: Interest or engagement in activities will improve ?Outcome: Adequate for Discharge ?Goal: Sleeping patterns will improve ?Outcome: Adequate for Discharge ?  ?Problem: Coping: ?Goal: Ability to verbalize frustrations and anger appropriately will improve ?Outcome: Adequate for Discharge ?Goal: Ability to demonstrate self-control will improve ?Outcome: Adequate for Discharge ?  ?Problem: Health Behavior/Discharge Planning: ?Goal: Identification of resources available to assist in meeting health care needs will improve ?Outcome: Adequate for Discharge ?Goal: Compliance with treatment plan for underlying cause of condition will improve ?Outcome: Adequate for Discharge ?  ?Problem: Physical Regulation: ?Goal: Ability to maintain clinical measurements within normal limits will improve ?Outcome: Adequate for Discharge ?  ?Problem: Safety: ?Goal: Periods of time without injury will increase ?Outcome: Adequate for Discharge ?  ?Problem: Education: ?Goal: Ability to state activities that reduce stress will improve ?Outcome: Adequate for Discharge ?  ?Problem: Education: ?Goal: Ability to state activities that reduce stress will improve ?Outcome: Adequate for Discharge ?  ?Problem: Coping: ?Goal: Ability to identify and develop effective coping behavior will improve ?Outcome: Adequate for Discharge ?  ?Problem: Self-Concept: ?Goal: Ability to identify factors that promote anxiety will improve ?Outcome: Adequate for Discharge ?Goal: Level of anxiety will decrease ?Outcome: Adequate for Discharge ?Goal: Ability to modify  response to factors that promote anxiety will improve ?Outcome: Adequate for Discharge ?  ?Problem: Education: ?Goal: Utilization of techniques to improve thought processes will improve ?Outcome: Adequate for Discharge ?Goal: Knowledge of the prescribed therapeutic regimen will improve ?Outcome: Adequate for Discharge ?  ?Problem: Activity: ?Goal: Interest or engagement in leisure activities will improve ?Outcome: Adequate for Discharge ?Goal: Imbalance in normal sleep/wake cycle will improve ?Outcome: Adequate for Discharge ?  ?Problem: Coping: ?Goal: Coping ability will improve ?Outcome: Adequate for Discharge ?Goal: Will verbalize feelings ?Outcome: Adequate for Discharge ?  ?Problem: Health Behavior/Discharge Planning: ?Goal: Ability to make decisions will improve ?Outcome: Adequate for Discharge ?Goal: Compliance with therapeutic regimen will improve ?Outcome: Adequate for Discharge ?  ?Problem: Role Relationship: ?Goal: Will demonstrate positive changes in social behaviors and relationships ?Outcome: Adequate for Discharge ?  ?Problem: Safety: ?Goal: Ability to disclose and discuss suicidal ideas will improve ?Outcome: Adequate for Discharge ?Goal: Ability to identify and utilize support systems that promote safety will improve ?Outcome: Adequate for Discharge ?  ?Problem: Self-Concept: ?Goal: Will verbalize positive feelings about self ?Outcome: Adequate for Discharge ?Goal: Level of anxiety will decrease ?Outcome: Adequate for Discharge ?  ?

## 2022-02-14 NOTE — Progress Notes (Signed)
Recreation Therapy Notes ? ?Date: 02/14/2022 ? ?Time: 10:30 am   ? ?Location: Craft room   ? ?Behavioral response: N/A ?  ?Intervention Topic: Communication   ? ?Discussion/Intervention: ?Patient refused to attend group.  ? ?Clinical Observations/Feedback:  ?Patient refused to attend group.  ?  ?[facility] LRT/CTRS ? ? ? ? ? ? ? ?Shyenne Maggard ?02/14/2022 12:22 PM ?

## 2022-02-14 NOTE — [facility] Suicide Risk Assessment (Signed)
[facility] Discharge Suicide Risk Assessment ? ? ?Principal Problem: MDD (major depressive disorder) ?Discharge Diagnoses: Principal Problem: ?  MDD (major depressive disorder) ?Active Problems: ?  Generalized anxiety disorder ?  Panic attacks ? ? ?Total Time spent with patient: 30 minutes ? ?Musculoskeletal: ?Strength & Muscle Tone: within normal limits ?Gait & Station: normal ?Patient leans: N/A ? ?Psychiatric Specialty Exam ? ?Presentation  ?General Appearance: Appropriate for Environment ? ?Eye Contact:Good ? ?Speech:Clear and Coherent ? ?Speech Volume:Normal ? ?Handedness:Right ? ? ?Mood and Affect  ?Mood:-- (stated "Depressed and suicidal") ? ?Duration of Depression Symptoms: Greater than two weeks ? ?Affect:Appropriate; Non-Congruent ? ? ?Thought Process  ?Thought Processes:Coherent ? ?Descriptions of Associations:Intact ? ?Orientation:Full (Time, Place and Person) ? ?Thought Content:WDL ? ?History of Schizophrenia/Schizoaffective disorder:No ? ?Duration of Psychotic Symptoms:No data recorded ?Hallucinations:No data recorded ?Ideas of Reference:None ? ?Suicidal Thoughts:No data recorded ?Homicidal Thoughts:No data recorded ? ?Sensorium  ?Memory:Immediate Good ? ?Judgment:Fair ? ?Insight:Fair ? ? ?Executive Functions  ?Concentration:Fair ? ?Attention Span:Fair ? ?Recall:Fair ? ?Fund of Knowledge:Fair ? ?Language:Fair ? ? ?Psychomotor Activity  ?Psychomotor Activity:No data recorded ? ?Assets  ?Assets:Social Support; Resilience; Physical Health; Housing ? ? ?Sleep  ?Sleep:No data recorded ? ?Physical Exam: ?Physical Exam ?Vitals and nursing note reviewed.  ?Constitutional:   ?   Appearance: Normal appearance.  ?HENT:  ?   Head: Normocephalic and atraumatic.  ?   Mouth/Throat:  ?   Pharynx: Oropharynx is clear.  ?Eyes:  ?   Pupils: Pupils are equal, round, and reactive to light.  ?Cardiovascular:  ?   Rate and Rhythm: Normal rate and regular rhythm.  ?Pulmonary:  ?   Effort: Pulmonary effort is normal.  ?   Breath  sounds: Normal breath sounds.  ?Abdominal:  ?   General: Abdomen is flat.  ?   Palpations: Abdomen is soft.  ?Musculoskeletal:     ?   General: Normal range of motion.  ?Skin: ?   General: Skin is warm and dry.  ?Neurological:  ?   General: No focal deficit present.  ?   Mental Status: He is alert. Mental status is at baseline.  ?Psychiatric:     ?   Mood and Affect: Mood normal.     ?   Thought Content: Thought content normal.  ? ?Review of Systems  ?Constitutional: Negative.   ?HENT: Negative.    ?Eyes: Negative.   ?Respiratory: Negative.    ?Cardiovascular: Negative.   ?Gastrointestinal: Negative.   ?Musculoskeletal: Negative.   ?Skin: Negative.   ?Neurological: Negative.   ?Psychiatric/Behavioral: Negative.    ?Blood pressure 124/85, pulse 64, temperature 98 ?F (36.7 ?C), temperature source Oral, resp. rate 18, height 6\' 2"  (1.88 m), weight 94.3 kg, SpO2 99 %. Body mass index is 26.71 kg/m?. ? ?Mental Status Per Nursing Assessment::   ?On Admission:  NA ? ?Demographic Factors:  ?Male and Caucasian ? ?Loss Factors: ?Financial problems/change in socioeconomic status ? ?Historical Factors: ?Impulsivity ? ?Risk Reduction Factors:   ?Sense of responsibility to family, Living with another person, especially a relative, and Positive therapeutic relationship ? ?Continued Clinical Symptoms:  ?Severe Anxiety and/or Agitation ?Depression:   Impulsivity ? ?Cognitive Features That Contribute To Risk:  ?None   ? ?Suicide Risk:  ?Minimal: No identifiable suicidal ideation.  Patients presenting with no risk factors but with morbid ruminations; may be classified as minimal risk based on the severity of the depressive symptoms ? ? Follow-up Information   ? ? [id number] Go on 02/19/2022.   ?  Why: Please present for scheduled appointment at [facility], meet Harvey at clinic, Wednesday 3/08 at 0730AM. ?Contact information: ?[address] ?[place] [address] ?[phone] ? ? ?  ?  ? ?  ?  ? ?  ? ? ?Plan Of  Care/Follow-up recommendations:  ?Patient denies all suicidal ideation.  Affect upbeat.  Mood stated as better.  Anxiety improved.  Agrees to outpatient mental health treatment as recommended ? ?Mordecai Rasmussen, MD ?02/14/2022, 10:30 AM ?

## 2022-02-14 NOTE — Group Note (Signed)
[facility] LCSW Group Therapy Note ? ? ?Group Date: 02/14/2022 ?Start Time: 1300 ?End Time: 1400 ? ?Type of Therapy and Topic:  Group Therapy:  Feelings around Relapse and Recovery ? ?Participation Level:  Did Not Attend  ? ? ?Description of Group:   ? Patients in this group will discuss emotions they experience before and after a relapse. They will process how experiencing these feelings, or avoidance of experiencing them, relates to having a relapse. Facilitator will guide patients to explore emotions they have related to recovery. Patients will be encouraged to process which emotions are more powerful. They will be guided to discuss the emotional reaction significant others in their lives may have to patients? relapse or recovery. Patients will be assisted in exploring ways to respond to the emotions of others without this contributing to a relapse. ? ?Therapeutic Goals: ?Patient will identify two or more emotions that lead to relapse for them:  ?Patient will identify two emotions that result when they relapse:  ?Patient will identify two emotions related to recovery:  ?Patient will demonstrate ability to communicate their needs through discussion and/or role plays. ? ? ?Summary of Patient Progress: ?X ? ? ?Therapeutic Modalities:   ?Cognitive Behavioral Therapy ?Solution-Focused Therapy ?Assertiveness Training ?Relapse Prevention Therapy ? ? ?Neomia Herbel R Tiffinie Caillier, LCSW ?

## 2022-03-12 ENCOUNTER — Other Ambulatory Visit: Payer: Self-pay

## 2022-03-12 ENCOUNTER — Emergency Department
Admission: EM | Admit: 2022-03-12 | Discharge: 2022-03-12 | Disposition: A | Discharge status: Home / Self Care | Payer: Medicaid Other | Attending: Emergency Medicine | Admitting: Emergency Medicine

## 2022-03-12 DIAGNOSIS — F411 Generalized anxiety disorder: Secondary | ICD-10-CM | POA: Insufficient documentation

## 2022-03-12 DIAGNOSIS — E876 Hypokalemia: Secondary | ICD-10-CM | POA: Insufficient documentation

## 2022-03-12 DIAGNOSIS — R519 Headache, unspecified: Secondary | ICD-10-CM | POA: Insufficient documentation

## 2022-03-12 LAB — URINALYSIS, COMPLETE (UACMP) WITH MICROSCOPIC
Bacteria, UA: NONE SEEN
Bilirubin Urine: NEGATIVE
Glucose, UA: NEGATIVE mg/dL
Hgb urine dipstick: NEGATIVE
Ketones, ur: NEGATIVE mg/dL
Leukocytes,Ua: NEGATIVE
Nitrite: NEGATIVE
Protein, ur: NEGATIVE mg/dL
Specific Gravity, Urine: 1.029 (ref 1.005–1.030)
pH: 5 (ref 5.0–8.0)

## 2022-03-12 LAB — BASIC METABOLIC PANEL
Anion gap: 7 (ref 5–15)
BUN: 10 mg/dL (ref 6–20)
CO2: 28 mmol/L (ref 22–32)
Calcium: 9.3 mg/dL (ref 8.9–10.3)
Chloride: 103 mmol/L (ref 98–111)
Creatinine, Ser: 0.72 mg/dL (ref 0.61–1.24)
GFR, Estimated: >60 mL/min (ref >60)
Glucose, Bld: 102 mg/dL — ABNORMAL HIGH (ref 70–99)
Potassium: 3.3 mmol/L — ABNORMAL LOW (ref 3.5–5.1)
Sodium: 138 mmol/L (ref 135–145)

## 2022-03-12 LAB — CBC
HCT: 45.6 % (ref 39.0–52.0)
Hemoglobin: 15.4 g/dL (ref 13.0–17.0)
MCH: 29.3 pg (ref 26.0–34.0)
MCHC: 33.8 g/dL (ref 30.0–36.0)
MCV: 86.7 fL (ref 80.0–100.0)
Platelets: 268 10*3/uL (ref 150–400)
RBC: 5.26 MIL/uL (ref 4.22–5.81)
RDW: 12 % (ref 11.5–15.5)
WBC: 7.8 10*3/uL (ref 4.0–10.5)
nRBC: 0 % (ref 0.0–0.2)

## 2022-03-12 MED ORDER — SODIUM CHLORIDE 0.9 % IV BOLUS
500.0000 mL | Freq: Once | INTRAVENOUS | Status: AC
  Administered 2022-03-12: 500 mL via INTRAVENOUS

## 2022-03-12 MED ORDER — LORAZEPAM 2 MG/ML IJ SOLN
1.5000 mg | Freq: Once | INTRAMUSCULAR | Status: AC
  Administered 2022-03-12: 1.5 mg via INTRAVENOUS
  Filled 2022-03-12: qty 1

## 2022-03-12 MED ORDER — BUTALBITAL-APAP-CAFFEINE 50-325-40 MG PO TABS
2.0000 | ORAL_TABLET | ORAL | Status: AC
  Administered 2022-03-12: 2 via ORAL
  Filled 2022-03-12: qty 2

## 2022-03-12 NOTE — ED Triage Notes (Addendum)
Pt comes with c/o migraine and states his BP was elevated earlier. Pt states he was suppose to take BP meds but doesn't take due to other prescribed meds for his depression and anxiety. ? ?Pt states he has anxiety. Pt states he feels like he has the shakes. Pt states he was playing video games earlier and felt like he was in and out. Almost like he had left his body. ? ?Pt denies any current SI or HI. ?

## 2022-03-12 NOTE — Discharge Instructions (Signed)
No driving or working in dangerous areas today as you were given lorazepam which can make you sleepy. ? ?Please follow-up closely with your psychiatrist at [facility], I recommend you call them today to schedule close follow-up. ? ?In addition, please discontinue your 20 mg Paxil and go back to taking your 10 mg Paxil once daily instead ?

## 2022-03-12 NOTE — ED Provider Notes (Signed)
? ?[facility] ?Provider Note ? ? ? Event Date/Time  ? First MD Initiated Contact with Patient 03/12/22 [phone]   ?  (approximate) ? ? ?History  ? ?Migraine ? ? ?HPI ? ?Lee Sandoval is a 31 y.o. male who upon my review of records from discharge summary on March 3 has a history of generalized anxiety disorder panic disorder major depression and ADHD ?  ?Patient has been experiencing feeling of anxiety now for the last couple weeks, he also reports that his symptoms of depression are better he is no longer having any suicidal thoughts or ideations, but as his depression has improved his anxiety and stress seems to be increasing.  He checked his blood pressure at home this morning and as high as [id number] systolic ? ?For the last several days he has been having a mild throbbing headache over the right side of his temple she described as "migraine" he reports has had this many times in the past that accompanies times where he feels very stressed out.  Seems to have a lot of difficulty with managing his depression as his depression improves he reports his anxiety often worsens ? ?No severe headache.  Reports he feels very anxious.  A little bit tremulousness.  Has been taking Paxil now for the last month roughly, and his doctor doubled his dose to 20 mg daily 2 weeks ago and he reports that is when he really noticed that the anxiety feeling started to worsen and thinks he needs to either get off that medication or go back down to his lower dose ? ?Denies any confusion or alteration of mental status.  No hallucinations ? ? ?Records reviewed, normotensive during his previous hospital stay at the time of discharge but was taken off amlodipine and started paroxetine ? ?Physical Exam  ? ?Triage Vital Signs: ?ED Triage Vitals  ?Enc Vitals Group  ?   BP 03/12/22 0833 (!) 144/111  ?   Pulse Rate 03/12/22 0833 84  ?   Resp 03/12/22 0833 18  ?   Temp 03/12/22 0833 98 ?F (36.7 ?C)  ?   Temp src --   ?   SpO2  03/12/22 0833 100 %  ?   Weight --   ?   Height --   ?   Head Circumference --   ?   Peak Flow --   ?   Pain Score 03/12/22 0826 7  ?   Pain Loc --   ?   Pain Edu? --   ?   Excl. in [place]? --   ? ? ?Most recent vital signs: ?Vitals:  ? 03/12/22 0833 03/12/22 1100  ?BP: (!) 144/111 126/74  ?Pulse: 84 63  ?Resp: 18 18  ?Temp: 98 ?F (36.7 ?C)   ?SpO2: 100% 96%  ? ? ? ?General: Awake, no distress.  In no acute distress.  Reports mild headache over the right side of scalp reports that normally he takes butalbital and this will improve his headache, requesting same ?CV:  Good peripheral perfusion.  Normal heart tones. ?Resp:  Normal effort.  Clear lungs bilaterally ?Abd:  No distention.  ?Other:  No noted tremors.  No muscular rigidity. ?He is fully alert well-oriented.  Extraocular movements are normal. ?Psychiatric: Denies suicidal ideations.  Reports significant anxiety, and appears anxious with a somewhat nervous and slightly pressured affect.   ? ? ? ? ?ED Results / Procedures / Treatments  ? ?Labs ?(all labs ordered are listed, but only  abnormal results are displayed) ?Labs Reviewed  ?BASIC METABOLIC PANEL - Abnormal; Notable for the following components:  ?    Result Value  ? Potassium 3.3 (*)   ? Glucose, Bld 102 (*)   ? All other components within normal limits  ?URINALYSIS, COMPLETE (UACMP) WITH MICROSCOPIC - Abnormal; Notable for the following components:  ? Color, Urine YELLOW (*)   ? APPearance HAZY (*)   ? All other components within normal limits  ?CBC  ? ? ? ?EKG ? ? ? ? ?RADIOLOGY ? ? ? ? ?PROCEDURES: ? ?Critical Care performed: No ? ?Procedures ? ? ?MEDICATIONS ORDERED IN ED: ?Medications  ?butalbital-acetaminophen-caffeine (FIORICET) 50-325-40 MG per tablet 2 tablet (2 tablets Oral Given 03/12/22 1034)  ?LORazepam (ATIVAN) injection 1.5 mg (1.5 mg Intravenous Given 03/12/22 1039)  ?sodium chloride 0.9 % bolus 500 mL (500 mLs Intravenous New Bag/Given 03/12/22 1040)  ? ? ? ?IMPRESSION / MDM / ASSESSMENT AND  PLAN / ED COURSE  ?I reviewed the triage vital signs and the nursing notes. ?             ?               ? ?Differential diagnosis includes, but is not limited to, possibly generalized anxiety disorder, possibly underlying hypertension, no clinical signs or symptoms of be highly suggestive of acute hypertensive emergency.  Normal reassuring neurologic examination.  Fully alert and oriented.  Reports history of the same really of anxiety, high blood pressure, headache as well, reports this happened over several times in the past. ? ?No fever.  No meningismus.  Full range of motion of neck without pain.  No side findings suggestive of acute infectious etiology or meningitis ? ?I do not believe CT imaging of the brain would be helpful at this time no focal neurologic deficits, reports history of headaches same in the past.  Blood pressure somewhat elevated with diastolic elevation here, will provide Fioricet and also provide dose of Ativan for columning and see if as his anxiety improves this may also improve his blood pressure and headache ? ?No signs of neuroleptic malignant syndrome or serotonin syndrome.  Normal mental status.  No muscular rigidity.  Would consider possibly decreasing his Paxil dose back down to 10 mg where he reports he had less severe anxiety but still helpful with treatment of his depression  ? ?----------------------------------------- ?11:14 AM on 03/12/2022 ?----------------------------------------- ?Labs reviewed, notable for mild hypokalemia.  Normal CBC.  Urinalysis negative for acute ? ?Patient reports he feels much better, he is resting comfortably, fully alert and oriented, reports he feels much improved he no longer feels anxious. ? ?His headache is gone away. ? ?He will go back to using his prior 10 mg Paxil which she still has at home, and will call [facility] to schedule close follow-up and inform them of his visit today. ? ? ?Patient's wife is driving him home.  He is agreeable to not  drive today or place himself in dangerous areas as he knows he was given a medication that can make him sleepy ? ?Return precautions and treatment recommendations and follow-up discussed with the patient who is agreeable with the plan. ? ? ?  ? ? ?FINAL CLINICAL IMPRESSION(S) / ED DIAGNOSES  ? ?Final diagnoses:  ?GAD (generalized anxiety disorder)  ?Recurrent headache  ? ? ? ?Rx / DC Orders  ? ?ED Discharge Orders   ? ? None  ? ?  ? ? ? ?Note:  This document was  prepared using Systems analyst and may include unintentional dictation errors. ?  Delman Kitten, MD ?03/12/22 1115 ? ?

## 2022-09-15 ENCOUNTER — Other Ambulatory Visit: Payer: Self-pay

## 2022-09-15 ENCOUNTER — Encounter: Payer: Self-pay | Admitting: Emergency Medicine

## 2022-09-15 ENCOUNTER — Emergency Department
Admission: EM | Admit: 2022-09-15 | Discharge: 2022-09-15 | Disposition: A | Discharge status: Home / Self Care | Payer: Medicaid Other | Attending: Emergency Medicine | Admitting: Emergency Medicine

## 2022-09-15 ENCOUNTER — Emergency Department: Payer: Medicaid Other

## 2022-09-15 DIAGNOSIS — H538 Other visual disturbances: Secondary | ICD-10-CM | POA: Insufficient documentation

## 2022-09-15 DIAGNOSIS — R55 Syncope and collapse: Secondary | ICD-10-CM

## 2022-09-15 LAB — BASIC METABOLIC PANEL
Anion gap: 5 (ref 5–15)
BUN: 14 mg/dL (ref 6–20)
CO2: 28 mmol/L (ref 22–32)
Calcium: 9.2 mg/dL (ref 8.9–10.3)
Chloride: 108 mmol/L (ref 98–111)
Creatinine, Ser: 0.75 mg/dL (ref 0.61–1.24)
GFR, Estimated: >60 mL/min (ref >60)
Glucose, Bld: 119 mg/dL — ABNORMAL HIGH (ref 70–99)
Potassium: 4.1 mmol/L (ref 3.5–5.1)
Sodium: 141 mmol/L (ref 135–145)

## 2022-09-15 LAB — URINALYSIS, ROUTINE W REFLEX MICROSCOPIC
Bacteria, UA: NONE SEEN
Bilirubin Urine: NEGATIVE
Glucose, UA: NEGATIVE mg/dL
Hgb urine dipstick: NEGATIVE
Ketones, ur: NEGATIVE mg/dL
Leukocytes,Ua: NEGATIVE
Nitrite: NEGATIVE
Protein, ur: 30 mg/dL — AB
Specific Gravity, Urine: 1.031 — ABNORMAL HIGH (ref 1.005–1.030)
pH: 6 (ref 5.0–8.0)

## 2022-09-15 LAB — CBC
HCT: 44.3 % (ref 39.0–52.0)
Hemoglobin: 14.5 g/dL (ref 13.0–17.0)
MCH: 28.3 pg (ref 26.0–34.0)
MCHC: 32.7 g/dL (ref 30.0–36.0)
MCV: 86.5 fL (ref 80.0–100.0)
Platelets: 281 10*3/uL (ref 150–400)
RBC: 5.12 MIL/uL (ref 4.22–5.81)
RDW: 13.1 % (ref 11.5–15.5)
WBC: 5.5 10*3/uL (ref 4.0–10.5)
nRBC: 0 % (ref 0.0–0.2)

## 2022-09-15 NOTE — ED Notes (Signed)
E signature pad not working. Pt educated on discharge instructions and verbalized understanding.  

## 2022-09-15 NOTE — ED Provider Triage Note (Signed)
Emergency Medicine Provider Triage Evaluation Note  Lee Sandoval , a 31 y.o. male  was evaluated in triage.  Pt complains of dizziness, palpitations.  Review of Systems  Positive: Dizziness, palpitations Negative: Fever, vomiting  Physical Exam  BP (!) 151/105 (BP Location: Left Arm)   Pulse 78   Temp 98.3 F (36.8 C) (Oral)   Resp 18   Ht 6\' 2"  (1.88 m)   Wt 94.3 kg   SpO2 100%   BMI 26.69 kg/m  Gen:   Awake, no distress   Resp:  Normal effort  MSK:   Moves extremities without difficulty  Other:    Medical Decision Making  Medically screening exam initiated at 4:47 PM.  Appropriate orders placed.  Lee Sandoval was informed that the remainder of the evaluation will be completed by another provider, this initial triage assessment does not replace that evaluation, and the importance of remaining in the ED until their evaluation is complete.  CT of the head and basic labs   Versie Starks, PA-C 09/15/22 1648

## 2022-09-15 NOTE — ED Triage Notes (Signed)
Today while sitting, felt/ hearing pulse in left ear and twitching to right eye, broke out into a cold sweat, felt dizzy.  Also right hand scar turned purple.  States currently feels out of sorts, lightheaded.  AAOX3.  Skin warm and dry. NAD.  MAE equally and strong. Speech clear.

## 2022-09-15 NOTE — ED Provider Notes (Signed)
[facility] Provider Note  Patient Contact: 9:28 PM (approximate)   History   Dizziness   HPI  Lee Sandoval is a 31 y.o. male who presents the emergency department for multiple complaints.  Appears that patient had 2 episodes both lasting approximately 45 seconds where he had blurred vision, felt like he was in a tunnel, about to pass out, pounding in his ears consistent with his heartbeat.  Patient states that he does have a history of anxiety and depression.  He states that he has had multiple symptoms of over the years attributed to his anxiety.  Patient states that he felt like he was going to pass out today which prompted him to come to the emergency department.  Symptoms are currently not present.     Physical Exam   Triage Vital Signs: ED Triage Vitals  Enc Vitals Group     BP 09/15/22 1646 (!) 151/105     Pulse Rate 09/15/22 1646 78     Resp 09/15/22 1646 18     Temp 09/15/22 1646 98.3 F (36.8 C)     Temp Source 09/15/22 1646 Oral     SpO2 09/15/22 1646 100 %     Weight 09/15/22 1531 207 lb 14.3 oz (94.3 kg)     Height 09/15/22 1531 6\' 2"  (1.88 m)     Head Circumference --      Peak Flow --      Pain Score 09/15/22 1531 4     Pain Loc --      Pain Edu? --      Excl. in [place]? --     Most recent vital signs: Vitals:   09/15/22 1646  BP: (!) 151/105  Pulse: 78  Resp: 18  Temp: 98.3 F (36.8 C)  SpO2: 100%     General: Alert and in no acute distress. Eyes:  PERRL. EOMI. ENT:      Ears:       Nose: No congestion/rhinnorhea.      Mouth/Throat: Mucous membranes are moist. Neck: No stridor. No cervical spine tenderness to palpation  Cardiovascular:  Good peripheral perfusion Respiratory: Normal respiratory effort without tachypnea or retractions. Lungs CTAB. Good air entry to the bases with no decreased or absent breath sounds. Musculoskeletal: Full range of motion to all extremities.  Neurologic:  No gross focal neurologic  deficits are appreciated.  No nerves II through XII grossly intact at this time. Skin:   No rash noted Other:   ED Results / Procedures / Treatments   Labs (all labs ordered are listed, but only abnormal results are displayed) Labs Reviewed  BASIC METABOLIC PANEL - Abnormal; Notable for the following components:      Result Value   Glucose, Bld 119 (*)    All other components within normal limits  URINALYSIS, ROUTINE W REFLEX MICROSCOPIC - Abnormal; Notable for the following components:   Color, Urine YELLOW (*)    APPearance CLEAR (*)    Specific Gravity, Urine 1.031 (*)    Protein, ur 30 (*)    All other components within normal limits  CBC  CBG MONITORING, ED     EKG  ED ECG REPORT I, 11/15/22 Sarann Tregre,  personally viewed and interpreted this ECG.   Date: 09/15/2022  EKG Time: 1651 hrs.  Rate: 60 bpm  Rhythm: unchanged from previous tracings, normal sinus rhythm  Axis: Normal axis  Intervals:none  ST&T Change: No ST elevation or depression noted   Normal  sinus rhythm.  No STEMI.  No significant changes from previous EKG from 01/09/2022.    RADIOLOGY  I personally viewed, evaluated, and interpreted these images as part of my medical decision making, as well as reviewing the written report by the radiologist.  ED Provider Interpretation: No acute intracranial findings on CT scan  CT HEAD WO CONTRAST (5MM)  Result Date: 09/15/2022 CLINICAL DATA:  Dizziness, persistent/recurrent, cardiac or vascular cause suspected EXAM: CT HEAD WITHOUT CONTRAST TECHNIQUE: Contiguous axial images were obtained from the base of the skull through the vertex without intravenous contrast. RADIATION DOSE REDUCTION: This exam was performed according to the departmental dose-optimization program which includes automated exposure control, adjustment of the mA and/or kV according to patient size and/or use of iterative reconstruction technique. COMPARISON:  CT head 03/10/2021 FINDINGS:  Brain: No evidence of large-territorial acute infarction. No parenchymal hemorrhage. No mass lesion. No extra-axial collection. No mass effect or midline shift. No hydrocephalus. Basilar cisterns are patent. Chronic flattened and diminutive pituitary gland. Vascular: No hyperdense vessel. Skull: No acute fracture or focal lesion. Chronic diminutive sella turcica. Sinuses/Orbits: Paranasal sinuses and mastoid air cells are clear. The orbits are unremarkable. Other: None. IMPRESSION: No acute intracranial abnormality. Electronically Signed   By: Iven Finn M.D.   On: 09/15/2022 18:31    PROCEDURES:  Critical Care performed: No  Procedures   MEDICATIONS ORDERED IN ED: Medications - No data to display   IMPRESSION / MDM / [place] / ED COURSE  I reviewed the triage vital signs and the nursing notes.                              Differential diagnosis includes, but is not limited to, syncope, vasovagal episode, stress reaction, aneurysm, CVA, vertigo   Patient's presentation is most consistent with acute presentation with potential threat to life or bodily function.   Patient's diagnosis is consistent with vasovagal episode.  Patient presented to the ED with a near syncopal episode.  This occurred twice today and lasted 45 seconds each.  Patient had a reassuring work-up, reassuring physical exam.  I do suspect that this is a secondary reaction to patient's anxiety and stress.  At this time no indication for further work-up.  CT scan labs, and EKG are reassuring at this time.  Follow-up primary care as needed.  Return precautions discussed with the patient..  Patient is given ED precautions to return to the ED for any worsening or new symptoms.        FINAL CLINICAL IMPRESSION(S) / ED DIAGNOSES   Final diagnoses:  Vasovagal episode     Rx / DC Orders   ED Discharge Orders     None        Note:  This document was prepared using Dragon voice recognition software  and may include unintentional dictation errors.   Brynda Peon 09/15/22 2136    Vanessa [place]ha, MD 09/15/22 2352

## 2023-01-05 ENCOUNTER — Encounter: Payer: Self-pay | Admitting: Emergency Medicine

## 2023-01-05 ENCOUNTER — Emergency Department
Admission: EM | Admit: 2023-01-05 | Discharge: 2023-01-05 | Disposition: A | Discharge status: Home / Self Care | Payer: Medicaid Other | Attending: Emergency Medicine | Admitting: Emergency Medicine

## 2023-01-05 DIAGNOSIS — F4389 Other reactions to severe stress: Secondary | ICD-10-CM

## 2023-01-05 DIAGNOSIS — F439 Reaction to severe stress, unspecified: Secondary | ICD-10-CM | POA: Insufficient documentation

## 2023-01-05 LAB — URINALYSIS, ROUTINE W REFLEX MICROSCOPIC
Bacteria, UA: NONE SEEN
Bilirubin Urine: NEGATIVE
Glucose, UA: NEGATIVE mg/dL
Hgb urine dipstick: NEGATIVE
Ketones, ur: 20 mg/dL — AB
Leukocytes,Ua: NEGATIVE
Nitrite: NEGATIVE
Protein, ur: 100 mg/dL — AB
Specific Gravity, Urine: 1.035 — ABNORMAL HIGH (ref 1.005–1.030)
pH: 5 (ref 5.0–8.0)

## 2023-01-05 LAB — CBC
HCT: 46.9 % (ref 39.0–52.0)
Hemoglobin: 15.8 g/dL (ref 13.0–17.0)
MCH: 28.6 pg (ref 26.0–34.0)
MCHC: 33.7 g/dL (ref 30.0–36.0)
MCV: 84.8 fL (ref 80.0–100.0)
Platelets: 329 10*3/uL (ref 150–400)
RBC: 5.53 MIL/uL (ref 4.22–5.81)
RDW: 12.3 % (ref 11.5–15.5)
WBC: 9.8 10*3/uL (ref 4.0–10.5)
nRBC: 0 % (ref 0.0–0.2)

## 2023-01-05 LAB — COMPREHENSIVE METABOLIC PANEL
ALT: 33 U/L (ref 0–44)
AST: 24 U/L (ref 15–41)
Albumin: 4.9 g/dL (ref 3.5–5.0)
Alkaline Phosphatase: 63 U/L (ref 38–126)
Anion gap: 11 (ref 5–15)
BUN: 11 mg/dL (ref 6–20)
CO2: 25 mmol/L (ref 22–32)
Calcium: 9.6 mg/dL (ref 8.9–10.3)
Chloride: 103 mmol/L (ref 98–111)
Creatinine, Ser: 0.8 mg/dL (ref 0.61–1.24)
GFR, Estimated: >60 mL/min (ref >60)
Glucose, Bld: 105 mg/dL — ABNORMAL HIGH (ref 70–99)
Potassium: 3.5 mmol/L (ref 3.5–5.1)
Sodium: 139 mmol/L (ref 135–145)
Total Bilirubin: 0.9 mg/dL (ref 0.3–1.2)
Total Protein: 8.3 g/dL — ABNORMAL HIGH (ref 6.5–8.1)

## 2023-01-05 LAB — LIPASE, BLOOD: Lipase: 32 U/L (ref 11–51)

## 2023-01-05 MED ORDER — LORAZEPAM 1 MG PO TABS: 1.0000 mg | ORAL_TABLET | Freq: Three times a day (TID) | ORAL | 0 refills | Status: AC | PRN

## 2023-01-05 MED ORDER — METOCLOPRAMIDE HCL 10 MG PO TABS: 10.0000 mg | ORAL_TABLET | Freq: Three times a day (TID) | ORAL | 0 refills | Status: AC | PRN

## 2023-01-05 MED ORDER — METOCLOPRAMIDE HCL 10 MG PO TABS
5.0000 mg | ORAL_TABLET | Freq: Once | ORAL | Status: AC
  Administered 2023-01-05: 5 mg via ORAL
  Filled 2023-01-05: qty 1

## 2023-01-05 MED ORDER — LORAZEPAM 1 MG PO TABS
1.0000 mg | ORAL_TABLET | Freq: Once | ORAL | Status: AC
  Administered 2023-01-05: 1 mg via ORAL
  Filled 2023-01-05: qty 1

## 2023-01-05 NOTE — ED Provider Notes (Signed)
[facility] Provider Note    Event Date/Time   First MD Initiated Contact with Patient 01/05/23 2250     (approximate)   History   Emesis   HPI  Lee Sandoval is a 32 y.o. male who presents with complaints of decreased p.o. intake and nausea.  Patient reports his marriage of 14 years ended about 9 days ago and since then he has not had much of an appetite and when he tries to eat something he feels very nauseated.  He thinks this is related to stress     Physical Exam   Triage Vital Signs: ED Triage Vitals  Enc Vitals Group     BP 01/05/23 2135 (!) 141/98     Pulse Rate 01/05/23 2135 92     Resp 01/05/23 2135 18     Temp 01/05/23 2135 98.7 F (37.1 C)     Temp Source 01/05/23 2135 Oral     SpO2 01/05/23 2135 96 %     Weight 01/05/23 2134 90.7 kg (200 lb)     Height 01/05/23 2134 1.88 m (6\' 2" )     Head Circumference --      Peak Flow --      Pain Score 01/05/23 2134 6     Pain Loc --      Pain Edu? --      Excl. in [place]? --     Most recent vital signs: Vitals:   01/05/23 2135 01/05/23 2313  BP: (!) 141/98 (!) 146/99  Pulse: 92 79  Resp: 18 16  Temp: 98.7 F (37.1 C)   SpO2: 96% 99%     General: Awake, no distress.  CV:  Good peripheral perfusion.  Resp:  Normal effort.  Abd:  No distention.  Other:     ED Results / Procedures / Treatments   Labs (all labs ordered are listed, but only abnormal results are displayed) Labs Reviewed  COMPREHENSIVE METABOLIC PANEL - Abnormal; Notable for the following components:      Result Value   Glucose, Bld 105 (*)    Total Protein 8.3 (*)    All other components within normal limits  LIPASE, BLOOD  CBC  URINALYSIS, ROUTINE W REFLEX MICROSCOPIC     EKG     RADIOLOGY     PROCEDURES:  Critical Care performed:   Procedures   MEDICATIONS ORDERED IN ED: Medications  metoCLOPramide (REGLAN) tablet 5 mg (5 mg Oral Given 01/05/23 2313)  LORazepam (ATIVAN) tablet 1 mg  (1 mg Oral Given 01/05/23 2313)     IMPRESSION / MDM / ASSESSMENT AND PLAN / ED COURSE  I reviewed the triage vital signs and the nursing notes. Patient's presentation is most consistent with acute illness / injury with system symptoms.  Patient presents with symptoms as above, likely stress reaction.  He has no abdominal pain or tenderness.  Lab work reviewed and is overall reassuring, no indication for IV fluids or electrolytes  No SI or HI, asked the patient whether he would like to speak to psychiatry and he was adamant that he would not  After discussing with patient we decided to try Reglan and an Ativan to see if this might help, he knows he can return anytime to the emergency department if symptoms worsen or if he needs other assistance        FINAL CLINICAL IMPRESSION(S) / ED DIAGNOSES   Final diagnoses:  Stress-related physiological response affecting physical condition  Rx / DC Orders   ED Discharge Orders          Ordered    LORazepam (ATIVAN) 1 MG tablet  Every 8 hours PRN        01/05/23 2302    metoCLOPramide (REGLAN) 10 MG tablet  Every 8 hours PRN        01/05/23 2302             Note:  This document was prepared using Dragon voice recognition software and may include unintentional dictation errors.   Lavonia Drafts, MD 01/05/23 [phone]

## 2023-01-05 NOTE — ED Triage Notes (Signed)
Pt presents via POV with complaints of Emesis with associated decreased appetite for the last several days. Pt states having marital issues and since that time he has had some N/V/D and now constipation for the last 9-10 days. Pt believes the social stressors are what contributing to his sx of his body "rejecting" food. Denies fevers, chills, cough, nasal congestion,  CP or SOB.

## 2023-01-25 ENCOUNTER — Emergency Department: Payer: Medicaid Other

## 2023-01-25 ENCOUNTER — Emergency Department
Admission: EM | Admit: 2023-01-25 | Discharge: 2023-01-25 | Disposition: A | Discharge status: Home / Self Care | Payer: Medicaid Other | Attending: Emergency Medicine | Admitting: Emergency Medicine

## 2023-01-25 ENCOUNTER — Other Ambulatory Visit: Payer: Self-pay

## 2023-01-25 DIAGNOSIS — I10 Essential (primary) hypertension: Secondary | ICD-10-CM | POA: Insufficient documentation

## 2023-01-25 DIAGNOSIS — R1031 Right lower quadrant pain: Secondary | ICD-10-CM | POA: Insufficient documentation

## 2023-01-25 LAB — COMPREHENSIVE METABOLIC PANEL
ALT: 35 U/L (ref 0–44)
AST: 25 U/L (ref 15–41)
Albumin: 4.5 g/dL (ref 3.5–5.0)
Alkaline Phosphatase: 55 U/L (ref 38–126)
Anion gap: 8 (ref 5–15)
BUN: 9 mg/dL (ref 6–20)
CO2: 27 mmol/L (ref 22–32)
Calcium: 9.4 mg/dL (ref 8.9–10.3)
Chloride: 104 mmol/L (ref 98–111)
Creatinine, Ser: 0.79 mg/dL (ref 0.61–1.24)
GFR, Estimated: >60 mL/min (ref >60)
Glucose, Bld: 103 mg/dL — ABNORMAL HIGH (ref 70–99)
Potassium: 3.4 mmol/L — ABNORMAL LOW (ref 3.5–5.1)
Sodium: 139 mmol/L (ref 135–145)
Total Bilirubin: 1.1 mg/dL (ref 0.3–1.2)
Total Protein: 7.4 g/dL (ref 6.5–8.1)

## 2023-01-25 LAB — CBC WITH DIFFERENTIAL/PLATELET
Abs Immature Granulocytes: 0.01 10*3/uL (ref 0.00–0.07)
Basophils Absolute: 0 10*3/uL (ref 0.0–0.1)
Basophils Relative: 0 %
Eosinophils Absolute: 0 10*3/uL (ref 0.0–0.5)
Eosinophils Relative: 1 %
HCT: 44.6 % (ref 39.0–52.0)
Hemoglobin: 15 g/dL (ref 13.0–17.0)
Immature Granulocytes: 0 %
Lymphocytes Relative: 31 %
Lymphs Abs: 2.1 10*3/uL (ref 0.7–4.0)
MCH: 28.8 pg (ref 26.0–34.0)
MCHC: 33.6 g/dL (ref 30.0–36.0)
MCV: 85.6 fL (ref 80.0–100.0)
Monocytes Absolute: 0.5 10*3/uL (ref 0.1–1.0)
Monocytes Relative: 7 %
Neutro Abs: 4.2 10*3/uL (ref 1.7–7.7)
Neutrophils Relative %: 61 %
Platelets: 286 10*3/uL (ref 150–400)
RBC: 5.21 MIL/uL (ref 4.22–5.81)
RDW: 12.9 % (ref 11.5–15.5)
WBC: 6.9 10*3/uL (ref 4.0–10.5)
nRBC: 0 % (ref 0.0–0.2)

## 2023-01-25 LAB — LIPASE, BLOOD: Lipase: 28 U/L (ref 11–51)

## 2023-01-25 MED ORDER — MORPHINE SULFATE (PF) 4 MG/ML IV SOLN
4.0000 mg | Freq: Once | INTRAVENOUS | Status: AC
  Administered 2023-01-25: 4 mg via INTRAVENOUS
  Filled 2023-01-25: qty 1

## 2023-01-25 MED ORDER — ONDANSETRON HCL 4 MG/2ML IJ SOLN
4.0000 mg | Freq: Once | INTRAMUSCULAR | Status: AC
  Administered 2023-01-25: 4 mg via INTRAVENOUS
  Filled 2023-01-25: qty 2

## 2023-01-25 MED ORDER — KETOROLAC TROMETHAMINE 30 MG/ML IJ SOLN
15.0000 mg | Freq: Once | INTRAMUSCULAR | Status: AC
  Administered 2023-01-25: 15 mg via INTRAVENOUS
  Filled 2023-01-25: qty 1

## 2023-01-25 MED ORDER — LACTATED RINGERS IV BOLUS
1000.0000 mL | Freq: Once | INTRAVENOUS | Status: AC
  Administered 2023-01-25: 1000 mL via INTRAVENOUS

## 2023-01-25 MED ORDER — ONDANSETRON 4 MG PO TBDP: 4.0000 mg | ORAL_TABLET | Freq: Three times a day (TID) | ORAL | 0 refills | Status: AC | PRN

## 2023-01-25 MED ORDER — IOHEXOL 350 MG/ML SOLN
100.0000 mL | Freq: Once | INTRAVENOUS | Status: AC | PRN
  Administered 2023-01-25: 75 mL via INTRAVENOUS

## 2023-01-25 NOTE — Discharge Instructions (Addendum)
Your blood work and CAT scan were reassuring.  You likely have a viral illness causing your pain and diarrhea.   Please go to the following website to schedule new (and existing) patient appointments:   [email or web address]   The following is a list of primary care offices in the area who are accepting new patients at this time.  Please reach out to one of them directly and let them know you would like to schedule an appointment to follow up on an Emergency Department visit, and/or to establish a new primary care provider (PCP).  There are likely other primary care clinics in the are who are accepting new patients, but this is an excellent place to start:  [place] physician: Dr Lavon Paganini [address] [place], [place] [address] [phone]  [facility] Lead Physician: Dr Steele Sizer [address], [place], [place] [address] [phone]  [place] Physician: Dr Park Liter [address] [place], [place] [address] [phone]  [facility] Lead Physician: Dr Dewaine Oats [address] [place], [place] [address] [phone]  [place] [facility] [place] Physician: Dr Halina Maidens [address] [place], [place], [place] [address] [phone]

## 2023-01-25 NOTE — ED Triage Notes (Signed)
Pt states RLQ abdominal pain for the last 2 days. Pt states stool is a yellow mustard liquid. Pt states pain comes in waves. 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 Pt states he does still have his gallbladder.

## 2023-01-25 NOTE — ED Notes (Signed)
Patient transported to CT 

## 2023-01-25 NOTE — ED Notes (Signed)
Patient returned from CT

## 2023-01-25 NOTE — ED Provider Notes (Signed)
[facility] Provider Note    Event Date/Time   First MD Initiated Contact with Patient 01/25/23 1150     (approximate)   History   Abdominal Pain   HPI  Lee Sandoval is a 32 y.o. male with past medical history of ADHD, anxiety hypertension and prior appendectomy who presents with right-sided abdominal pain.  Symptoms started yesterday around 8 AM.  Endorses pain in the right mid to low abdomen that is dull and there is a shooting component that intermittently worsens.  Worsens after he eats or with movement.  He is also had multiple yellow stools that are loose.  Denies blood in the stool.  Has had some nausea but no vomiting.  Denies urinary symptoms denies fevers did have some chills last night.  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 --      Excl. in [place]? --     Most recent vital signs: Vitals:   01/25/23 1150  BP: 124/82  Pulse: 82  Resp: 20  Temp: 98.1 F (36.7 C)  SpO2: 98%     General: Awake, no distress.  CV:  Good peripheral perfusion.  Resp:  Normal effort.  Abd:  No distention.  Tenderness to palpation in the right lower quadrant no guarding, minimal tenderness in the right upper quadrant neuro:             Awake, Alert, Oriented x 3  Other:  Normal external GU exam, no inguinal hernia, no testicular tenderness   ED Results / Procedures / Treatments  Labs (all labs ordered are listed, but only abnormal results are displayed) Labs Reviewed  COMPREHENSIVE METABOLIC PANEL - Abnormal; Notable for the following components:      Result Value   Potassium 3.4 (*)    Glucose, Bld 103 (*)    All other components within normal limits  CBC WITH DIFFERENTIAL/PLATELET  LIPASE, BLOOD  URINALYSIS, ROUTINE W REFLEX MICROSCOPIC  EKG     RADIOLOGY I reviewed and interpreted CT abdomen pelvis is negative for acute abdominal process   PROCEDURES:  Critical Care performed: No  Procedures  The patient is on the cardiac monitor to evaluate for evidence of arrhythmia and/or significant heart rate changes.   MEDICATIONS ORDERED IN ED: Medications  morphine (PF) 4 MG/ML injection 4 mg (4 mg Intravenous Given 01/25/23 1207)  ondansetron (ZOFRAN) injection 4 mg (4 mg Intravenous Given 01/25/23 1207)  iohexol (OMNIPAQUE) 350 MG/ML injection 100 mL (75 mLs Intravenous Contrast Given 01/25/23 1232)  lactated ringers bolus 1,000 mL (1,000 mLs Intravenous New Bag/Given 01/25/23 1258)  ketorolac (TORADOL) 30 MG/ML injection 15 mg (15 mg Intravenous Given 01/25/23 1312)     IMPRESSION / MDM / ASSESSMENT AND PLAN / ED COURSE  I reviewed the triage vital signs and the  nursing notes.                              Patient's presentation is most consistent with acute complicated illness / injury requiring diagnostic workup.  Differential diagnosis includes, but is not limited to, nephrolithiasis, enteritis, mesenteric adenitis, colitis, biliary colic, cholecystitis  The patient is a 32 year old male who is status post appendectomy who presents with right-sided abdominal pain and diarrhea since yesterday.  Started yesterday morning distal located in right lower quadrant and there is intermittent times where it becomes more intense and sharp.  He has had multiple episodes of yellow stool that is loose.  He is also had nausea but no fevers but some chills.  No urinary symptoms or testicular pain.  Vitals are reassuring.  On exam he is tearful and looks uncomfortable.  Abdominal exam overall is benign and not consistent peritonitis does have tenderness in the right lower quadrant no guarding also mildly tender in the right mid abdomen minimal right upper quadrant tenderness.  Reassuring GU exam.  Performed bedside ultrasound to evaluate the right upper quadrant and I do not see any obvious signs of cholecystitis there is no stones normal gallbladder wall no Perico cystic fluid.  Was evaluated the kidney on the right there is no hydronephrosis.  My suspicion is that this is likely enteritis.  Given patient's degree of discomfort we will obtain a CT of the abdomen pelvis.  Will obtain labs urine and treat pain with IV opiates.      FINAL CLINICAL IMPRESSION(S) / ED DIAGNOSES   Final diagnoses:  RLQ abdominal pain     Rx / DC Orders   ED Discharge Orders          Ordered    ondansetron (ZOFRAN-ODT) 4 MG disintegrating tablet  Every 8 hours PRN        01/25/23 1302             Note:  This document was prepared using Dragon voice recognition software and may include unintentional dictation errors.   Rada Hay, MD 01/25/23 1323

## 2023-02-03 ENCOUNTER — Other Ambulatory Visit (HOSPITAL_COMMUNITY): Payer: Self-pay

## 2024-11-16 ENCOUNTER — Emergency Department
Admission: EM | Admit: 2024-11-16 | Discharge: 2024-11-16 | Disposition: A | Discharge status: Home / Self Care | Payer: Self-pay | Attending: Emergency Medicine | Admitting: Emergency Medicine

## 2024-11-16 ENCOUNTER — Encounter: Payer: Self-pay | Admitting: Intensive Care

## 2024-11-16 ENCOUNTER — Other Ambulatory Visit: Payer: Self-pay

## 2024-11-16 DIAGNOSIS — I10 Essential (primary) hypertension: Secondary | ICD-10-CM | POA: Insufficient documentation

## 2024-11-16 DIAGNOSIS — J069 Acute upper respiratory infection, unspecified: Secondary | ICD-10-CM | POA: Insufficient documentation

## 2024-11-16 NOTE — ED Triage Notes (Addendum)
 Patient c/o body aches, chills, and N/V. Reports co-workers have tested positive for flu

## 2024-11-16 NOTE — ED Provider Notes (Signed)
 [facility] Provider Note    Event Date/Time   First MD Initiated Contact with Patient 11/16/24 [phone]     (approximate)   History   No chief complaint on file.   HPI  Lee Sandoval is a 33 y.o. male with PMH of hypertension, GERD, ADHD and GAD presents for evaluation of flulike symptoms.  Patient states that multiple coworkers have tested positive for the flu.  He endorses body aches chills, nausea and vomiting.  He has had occasional cough and congestion.  Not sure if he has had a fever has not checked his temperature.  Patient states that he was required by his employer to get a note for work.      Physical Exam   Triage Vital Signs: ED Triage Vitals  Encounter Vitals Group     BP 11/16/24 0911 (!) 140/92     Girls Systolic BP Percentile --      Girls Diastolic BP Percentile --      Boys Systolic BP Percentile --      Boys Diastolic BP Percentile --      Pulse Rate 11/16/24 0911 83     Resp 11/16/24 0911 18     Temp 11/16/24 0911 98.3 F (36.8 C)     Temp Source 11/16/24 0911 Oral     SpO2 11/16/24 0911 96 %     Weight 11/16/24 0909 215 lb (97.5 kg)     Height 11/16/24 0909 6' 2 (1.88 m)     Head Circumference --      Peak Flow --      Pain Score 11/16/24 0908 5     Pain Loc --      Pain Education --      Exclude from Growth Chart --     Most recent vital signs: Vitals:   11/16/24 0911  BP: (!) 140/92  Pulse: 83  Resp: 18  Temp: 98.3 F (36.8 C)  SpO2: 96%   General: Awake, no distress.  CV:  Good peripheral perfusion.  RRR. Resp:  Normal effort.  CTAB. Abd:  No distention.  Other:  Oral mucous membranes are moist, pharynx is erythematous, no tonsillar enlargement or exudate.   ED Results / Procedures / Treatments   Labs (all labs ordered are listed, but only abnormal results are displayed) Labs Reviewed - No data to display   PROCEDURES:  Critical Care performed: No  Procedures   MEDICATIONS ORDERED IN  ED: Medications - No data to display   IMPRESSION / MDM / ASSESSMENT AND PLAN / ED COURSE  I reviewed the triage vital signs and the nursing notes.                             33 year old male presents for evaluation of flulike symptoms.  Blood pressure is a little bit elevated, vital signs stable otherwise.  Patient NAD and nontoxic appearing on exam.  Differential diagnosis includes, but is not limited to, flu, COVID, RSV, other viral infection.  Patient's presentation is most consistent with acute, uncomplicated illness.  Offered to do a respiratory panel but explained to patient that this would not change my management.  Patient stated he did not need this for work so he was okay without getting tested today.  Patient states he just needs a note for work.  He states he will take over-the-counter cold medicine as needed.  Advised to return to the emergency  department with any worsening symptoms.  He voiced understanding, all questions were answered and he was stable at discharge.      FINAL CLINICAL IMPRESSION(S) / ED DIAGNOSES   Final diagnoses:  Viral URI     Rx / DC Orders   ED Discharge Orders     None        Note:  This document was prepared using Dragon voice recognition software and may include unintentional dictation errors.   Cleaster Tinnie LABOR, PA-C 11/16/24 9071    Viviann Pastor, MD 11/16/24 [phone]

## 2024-11-16 NOTE — Discharge Instructions (Signed)
 Your symptoms are consistent with a viral illness which will resolve on its own with time.  You do not need an antibiotic.  You can take over-the-counter cold medicine as needed to manage your symptoms.  If you are taking combination cold medicine keep in mind that this often contains Tylenol  so if you need additional medication for body aches or fever control please take Motrin  or ibuprofen .  Your symptoms should resolve with time, if you have had symptoms for greater than 10 days please be evaluated by another healthcare provider as at this point it may have developed into a bacterial infection which requires a different treatment.  Return to the emergency department with worsening symptoms.

## 2024-11-24 ENCOUNTER — Other Ambulatory Visit: Payer: Self-pay

## 2024-11-24 ENCOUNTER — Encounter: Payer: Self-pay | Admitting: Intensive Care

## 2024-11-24 ENCOUNTER — Emergency Department
Admission: EM | Admit: 2024-11-24 | Discharge: 2024-11-24 | Disposition: A | Discharge status: Home / Self Care | Payer: Self-pay

## 2024-11-24 DIAGNOSIS — U071 COVID-19: Secondary | ICD-10-CM | POA: Insufficient documentation

## 2024-11-24 DIAGNOSIS — F17210 Nicotine dependence, cigarettes, uncomplicated: Secondary | ICD-10-CM | POA: Insufficient documentation

## 2024-11-24 LAB — RESP PANEL BY RT-PCR (RSV, FLU A&B, COVID)  RVPGX2
Influenza A by PCR: NEGATIVE
Influenza B by PCR: NEGATIVE
Resp Syncytial Virus by PCR: NEGATIVE
SARS Coronavirus 2 by RT PCR: POSITIVE — AB

## 2024-11-24 LAB — COMPREHENSIVE METABOLIC PANEL WITH GFR
ALT: 20 U/L (ref 0–44)
AST: 20 U/L (ref 15–41)
Albumin: 4.4 g/dL (ref 3.5–5.0)
Alkaline Phosphatase: 60 U/L (ref 38–126)
Anion gap: 10 (ref 5–15)
BUN: 7 mg/dL (ref 6–20)
CO2: 28 mmol/L (ref 22–32)
Calcium: 9.2 mg/dL (ref 8.9–10.3)
Chloride: 99 mmol/L (ref 98–111)
Creatinine, Ser: 0.88 mg/dL (ref 0.61–1.24)
GFR, Estimated: >60 mL/min (ref >60)
Glucose, Bld: 92 mg/dL (ref 70–99)
Potassium: 3.8 mmol/L (ref 3.5–5.1)
Sodium: 137 mmol/L (ref 135–145)
Total Bilirubin: 0.4 mg/dL (ref 0.0–1.2)
Total Protein: 7 g/dL (ref 6.5–8.1)

## 2024-11-24 LAB — CBC WITH DIFFERENTIAL/PLATELET
Abs Immature Granulocytes: 0.02 K/uL (ref 0.00–0.07)
Basophils Absolute: 0 K/uL (ref 0.0–0.1)
Basophils Relative: 0 %
Eosinophils Absolute: 0 K/uL (ref 0.0–0.5)
Eosinophils Relative: 0 %
HCT: 40.9 % (ref 39.0–52.0)
Hemoglobin: 13.8 g/dL (ref 13.0–17.0)
Immature Granulocytes: 0 %
Lymphocytes Relative: 8 %
Lymphs Abs: 0.5 K/uL — ABNORMAL LOW (ref 0.7–4.0)
MCH: 29.7 pg (ref 26.0–34.0)
MCHC: 33.7 g/dL (ref 30.0–36.0)
MCV: 88.1 fL (ref 80.0–100.0)
Monocytes Absolute: 0.9 K/uL (ref 0.1–1.0)
Monocytes Relative: 13 %
Neutro Abs: 5.6 K/uL (ref 1.7–7.7)
Neutrophils Relative %: 79 %
Platelets: 233 K/uL (ref 150–400)
RBC: 4.64 MIL/uL (ref 4.22–5.81)
RDW: 12.3 % (ref 11.5–15.5)
WBC: 7.1 K/uL (ref 4.0–10.5)
nRBC: 0 % (ref 0.0–0.2)

## 2024-11-24 MED ORDER — ACETAMINOPHEN 325 MG PO TABS
650.0000 mg | ORAL_TABLET | Freq: Once | ORAL | Status: AC
  Administered 2024-11-24: 650 mg via ORAL
  Filled 2024-11-24: qty 2

## 2024-11-24 NOTE — ED Provider Notes (Signed)
 [facility] Provider Note    Event Date/Time   First MD Initiated Contact with Patient 11/24/24 1126     (approximate)   History   Fever and Emesis   HPI  Lee Sandoval is a 33 y.o. male with a past medical history of major depression, anxiety, obsessive-compulsive disorder who presents today for evaluation of sore throat, cough, feeling rundown, for the past couple of days.  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  Reports that he had a fever couple of days ago but this has resolved.  No chills.  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   Clinical depression 08/08/2008   Obsessive-compulsive disorder 08/08/2008          Physical Exam   Triage Vital Signs: ED Triage Vitals  Encounter Vitals Group     BP 11/24/24 1118 120/82     Girls Systolic BP Percentile --      Girls Diastolic BP Percentile --      Boys Systolic BP Percentile --      Boys Diastolic BP Percentile --      Pulse Rate 11/24/24 1118 92     Resp 11/24/24 1118 16     Temp 11/24/24 1118 98.6 F (37 C)     Temp Source 11/24/24 1118 Oral     SpO2 11/24/24 1118 95 %     Weight 11/24/24 1116 210 lb (95.3 kg)     Height 11/24/24 1116 6' 2 (1.88 m)     Head Circumference --      Peak Flow --      Pain Score 11/24/24 1116 7     Pain Loc --      Pain Education --      Exclude from Growth Chart --     Most recent vital signs: Vitals:   11/24/24 1118  BP: 120/82  Pulse: 92  Resp: 16   Temp: 98.6 F (37 C)  SpO2: 95%    Physical Exam Vitals and nursing note reviewed.  Constitutional:      General: Awake and alert. No acute distress.    Appearance: Normal appearance. The patient is normal weight.  HENT:     Head: Normocephalic and atraumatic.     Mouth: Mucous membranes are moist.   Mild congestion noted. Eyes:     General: PERRL. Normal EOMs        Right eye: No discharge.        Left eye: No discharge.     Conjunctiva/sclera: Conjunctivae normal.  Cardiovascular:     Rate and Rhythm: Normal rate and regular rhythm.     Pulses: Normal pulses.  Pulmonary:     Effort: Pulmonary effort is normal. No respiratory distress.     Breath sounds: Normal breath sounds.  Abdominal:     Abdomen is soft. There is no abdominal tenderness. No rebound or guarding. No distention. Musculoskeletal:        General: No swelling. Normal range of motion.     Cervical back: Normal range  of motion and neck supple.  Skin:    General: Skin is warm and dry.     Capillary Refill: Capillary refill takes less than 2 seconds.     Findings: No rash.  Neurological:     Mental Status: The patient is awake and alert.      ED Results / Procedures / Treatments   Labs (all labs ordered are listed, but only abnormal results are displayed) Labs Reviewed  RESP PANEL BY RT-PCR (RSV, FLU A&B, COVID)  RVPGX2 - Abnormal; Notable for the following components:      Result Value   SARS Coronavirus 2 by RT PCR POSITIVE (*)    All other components within normal limits  CBC WITH DIFFERENTIAL/PLATELET - Abnormal; Notable for the following components:   Lymphs Abs 0.5 (*)    All other components within normal limits  COMPREHENSIVE METABOLIC PANEL WITH GFR     EKG     RADIOLOGY     PROCEDURES:  Critical Care performed:   Procedures   MEDICATIONS ORDERED IN ED: Medications  acetaminophen  (TYLENOL ) tablet 650 mg (650 mg Oral Given 11/24/24 1158)     IMPRESSION / MDM /  ASSESSMENT AND PLAN / ED COURSE  I reviewed the triage vital signs and the nursing notes.   Differential diagnosis includes, but is not limited to, influenza, COVID-19, other URI.  I reviewed the patient's chart.  Patient was seen 8 days ago with similar complaint.  Swab was negative at that time.  Patient is awake and alert, hemodynamically stable and afebrile.  He is nontoxic in appearance.  Abdomen is soft and nontender throughout.  Labs were obtained in triage which are overall reassuring.  COVID/flu/RSV swab obtained is positive for COVID-19.  We discussed symptomatic management and return precautions.  He was given a dose of Tylenol  in the emergency department with good effect.  He is given a work note.  We discussed that this is highly contagious to others and he was given a work note.  We also discussed isolation instructions as much as possible unless he requires medical attention. Patient understands and agrees with plan.  Discharged in stable condition.   Patient's presentation is most consistent with acute complicated illness / injury requiring diagnostic workup.     FINAL CLINICAL IMPRESSION(S) / ED DIAGNOSES   Final diagnoses:  COVID-19     Rx / DC Orders   ED Discharge Orders     None        Note:  This document was prepared using Dragon voice recognition software and may include unintentional dictation errors.   Nandana Krolikowski E, PA-C 11/24/24 1245    Waymond Lorelle Cummins, MD 11/24/24 [phone]

## 2024-11-24 NOTE — ED Triage Notes (Signed)
 Patient c/o fever, emesis, and body aches since yesterday

## 2024-11-24 NOTE — Discharge Instructions (Signed)
 Your blood work is normal, though your swab is positive for COVID-19.  Remember you are highly contagious to others, so trying to stay home is much as you can unless you require medical attention.  He may take Tylenol /ibuprofen  per package instructions to help with your symptoms.  Please return for any new, worsening, or changing symptoms or other concerns.  It was a pleasure caring for you today.
